# Patient Record
Sex: Female | Born: 1970 | Race: Black or African American | Hispanic: No | Marital: Married | Smoking: Current every day smoker
Health system: Southern US, Community
[De-identification: ages and names within clinical notes are randomized; demographics above are authoritative.]

## PROBLEM LIST (undated history)

## (undated) DIAGNOSIS — B019 Varicella without complication: Secondary | ICD-10-CM

## (undated) DIAGNOSIS — Z86718 Personal history of other venous thrombosis and embolism: Secondary | ICD-10-CM

## (undated) DIAGNOSIS — I10 Essential (primary) hypertension: Secondary | ICD-10-CM

## (undated) DIAGNOSIS — M75100 Unspecified rotator cuff tear or rupture of unspecified shoulder, not specified as traumatic: Secondary | ICD-10-CM

## (undated) HISTORY — DX: Varicella without complication: B01.9

## (undated) HISTORY — DX: Personal history of other venous thrombosis and embolism: Z86.718

## (undated) HISTORY — PX: TUBAL LIGATION: SHX77

## (undated) HISTORY — PX: BREAST REDUCTION SURGERY: SHX8

## (undated) HISTORY — PX: OTHER SURGICAL HISTORY: SHX169

## (undated) HISTORY — PX: BREAST SURGERY: SHX581

---

## 1997-12-29 ENCOUNTER — Ambulatory Visit (HOSPITAL_COMMUNITY): Admission: RE | Admit: 1997-12-29 | Discharge: 1997-12-29 | Payer: Self-pay | Admitting: Obstetrics

## 1997-12-29 ENCOUNTER — Other Ambulatory Visit: Admission: RE | Admit: 1997-12-29 | Discharge: 1997-12-29 | Payer: Self-pay | Admitting: Obstetrics

## 1998-02-24 ENCOUNTER — Inpatient Hospital Stay (HOSPITAL_COMMUNITY): Admission: AD | Admit: 1998-02-24 | Discharge: 1998-02-24 | Payer: Self-pay | Admitting: Obstetrics

## 1998-03-12 ENCOUNTER — Ambulatory Visit (HOSPITAL_COMMUNITY): Admission: RE | Admit: 1998-03-12 | Discharge: 1998-03-12 | Payer: Self-pay | Admitting: Obstetrics

## 1998-05-07 ENCOUNTER — Ambulatory Visit (HOSPITAL_COMMUNITY): Admission: RE | Admit: 1998-05-07 | Discharge: 1998-05-07 | Payer: Self-pay | Admitting: Obstetrics

## 1998-05-29 ENCOUNTER — Inpatient Hospital Stay (HOSPITAL_COMMUNITY): Admission: AD | Admit: 1998-05-29 | Discharge: 1998-05-31 | Payer: Self-pay | Admitting: Obstetrics

## 1999-03-23 ENCOUNTER — Other Ambulatory Visit: Admission: RE | Admit: 1999-03-23 | Discharge: 1999-03-23 | Payer: Self-pay | Admitting: Obstetrics

## 1999-04-07 ENCOUNTER — Encounter: Payer: Self-pay | Admitting: *Deleted

## 1999-04-07 ENCOUNTER — Inpatient Hospital Stay (HOSPITAL_COMMUNITY): Admission: AD | Admit: 1999-04-07 | Discharge: 1999-04-07 | Payer: Self-pay | Admitting: Obstetrics

## 1999-07-08 ENCOUNTER — Inpatient Hospital Stay (HOSPITAL_COMMUNITY): Admission: AD | Admit: 1999-07-08 | Discharge: 1999-07-08 | Payer: Self-pay | Admitting: Obstetrics

## 1999-09-09 ENCOUNTER — Inpatient Hospital Stay (HOSPITAL_COMMUNITY): Admission: AD | Admit: 1999-09-09 | Discharge: 1999-09-09 | Payer: Self-pay | Admitting: Obstetrics

## 1999-09-13 ENCOUNTER — Ambulatory Visit (HOSPITAL_COMMUNITY): Admission: RE | Admit: 1999-09-13 | Discharge: 1999-09-13 | Payer: Self-pay | Admitting: Obstetrics

## 1999-09-13 ENCOUNTER — Encounter: Payer: Self-pay | Admitting: Obstetrics

## 1999-09-15 ENCOUNTER — Encounter (HOSPITAL_COMMUNITY): Admission: RE | Admit: 1999-09-15 | Discharge: 1999-10-18 | Payer: Self-pay | Admitting: Obstetrics

## 1999-10-04 ENCOUNTER — Encounter: Payer: Self-pay | Admitting: Obstetrics

## 1999-10-12 ENCOUNTER — Observation Stay (HOSPITAL_COMMUNITY): Admission: AD | Admit: 1999-10-12 | Discharge: 1999-10-12 | Payer: Self-pay | Admitting: Obstetrics

## 1999-10-16 ENCOUNTER — Inpatient Hospital Stay (HOSPITAL_COMMUNITY): Admission: AD | Admit: 1999-10-16 | Discharge: 1999-10-16 | Payer: Self-pay | Admitting: Obstetrics

## 1999-10-17 ENCOUNTER — Encounter (INDEPENDENT_AMBULATORY_CARE_PROVIDER_SITE_OTHER): Payer: Self-pay

## 1999-10-17 ENCOUNTER — Inpatient Hospital Stay (HOSPITAL_COMMUNITY): Admission: AD | Admit: 1999-10-17 | Discharge: 1999-10-20 | Payer: Self-pay | Admitting: Obstetrics

## 2000-07-08 ENCOUNTER — Emergency Department (HOSPITAL_COMMUNITY): Admission: EM | Admit: 2000-07-08 | Discharge: 2000-07-09 | Payer: Self-pay | Admitting: Emergency Medicine

## 2000-07-09 ENCOUNTER — Encounter: Payer: Self-pay | Admitting: Emergency Medicine

## 2000-07-11 ENCOUNTER — Encounter: Admission: RE | Admit: 2000-07-11 | Discharge: 2000-07-11 | Payer: Self-pay | Admitting: Family Medicine

## 2000-10-18 ENCOUNTER — Encounter: Admission: RE | Admit: 2000-10-18 | Discharge: 2000-10-18 | Payer: Self-pay | Admitting: Family Medicine

## 2002-01-17 ENCOUNTER — Encounter: Admission: RE | Admit: 2002-01-17 | Discharge: 2002-01-17 | Payer: Self-pay | Admitting: Family Medicine

## 2002-02-18 ENCOUNTER — Encounter: Admission: RE | Admit: 2002-02-18 | Discharge: 2002-02-18 | Payer: Self-pay | Admitting: Family Medicine

## 2003-11-18 ENCOUNTER — Emergency Department (HOSPITAL_COMMUNITY): Admission: EM | Admit: 2003-11-18 | Discharge: 2003-11-18 | Payer: Self-pay | Admitting: Emergency Medicine

## 2005-01-20 ENCOUNTER — Emergency Department (HOSPITAL_COMMUNITY): Admission: EM | Admit: 2005-01-20 | Discharge: 2005-01-21 | Payer: Self-pay | Admitting: Emergency Medicine

## 2007-11-11 ENCOUNTER — Ambulatory Visit: Payer: Self-pay | Admitting: *Deleted

## 2007-11-11 ENCOUNTER — Ambulatory Visit: Payer: Self-pay | Admitting: Internal Medicine

## 2012-01-11 ENCOUNTER — Other Ambulatory Visit: Payer: Self-pay | Admitting: Surgical

## 2012-01-11 MED ORDER — BUPIVACAINE LIPOSOME 1.3 % IJ SUSP: 20.0000 mL | Freq: Once | INTRAMUSCULAR | Status: DC

## 2012-01-12 ENCOUNTER — Encounter (HOSPITAL_COMMUNITY): Payer: Self-pay | Admitting: Pharmacy Technician

## 2012-01-16 ENCOUNTER — Ambulatory Visit (HOSPITAL_COMMUNITY)
Admission: RE | Admit: 2012-01-16 | Discharge: 2012-01-16 | Disposition: A | Discharge status: Home / Self Care | Payer: Medicaid Other | Source: Ambulatory Visit | Attending: Orthopedic Surgery | Admitting: Orthopedic Surgery

## 2012-01-16 ENCOUNTER — Encounter (HOSPITAL_COMMUNITY)
Admission: RE | Admit: 2012-01-16 | Discharge: 2012-01-16 | Disposition: A | Discharge status: Home / Self Care | Payer: Medicaid Other | Source: Ambulatory Visit | Attending: Orthopedic Surgery | Admitting: Orthopedic Surgery

## 2012-01-16 ENCOUNTER — Encounter (HOSPITAL_COMMUNITY): Payer: Self-pay

## 2012-01-16 DIAGNOSIS — J984 Other disorders of lung: Secondary | ICD-10-CM | POA: Insufficient documentation

## 2012-01-16 DIAGNOSIS — Z01818 Encounter for other preprocedural examination: Secondary | ICD-10-CM | POA: Insufficient documentation

## 2012-01-16 DIAGNOSIS — Z01812 Encounter for preprocedural laboratory examination: Secondary | ICD-10-CM | POA: Insufficient documentation

## 2012-01-16 DIAGNOSIS — X58XXXA Exposure to other specified factors, initial encounter: Secondary | ICD-10-CM | POA: Insufficient documentation

## 2012-01-16 DIAGNOSIS — Z01810 Encounter for preprocedural cardiovascular examination: Secondary | ICD-10-CM | POA: Insufficient documentation

## 2012-01-16 DIAGNOSIS — I440 Atrioventricular block, first degree: Secondary | ICD-10-CM | POA: Insufficient documentation

## 2012-01-16 DIAGNOSIS — Z87891 Personal history of nicotine dependence: Secondary | ICD-10-CM | POA: Insufficient documentation

## 2012-01-16 DIAGNOSIS — S43429A Sprain of unspecified rotator cuff capsule, initial encounter: Secondary | ICD-10-CM | POA: Insufficient documentation

## 2012-01-16 HISTORY — DX: Essential (primary) hypertension: I10

## 2012-01-16 HISTORY — DX: Unspecified rotator cuff tear or rupture of unspecified shoulder, not specified as traumatic: M75.100

## 2012-01-16 LAB — BASIC METABOLIC PANEL
BUN: 14 mg/dL (ref 6–23)
Chloride: 103 mEq/L (ref 96–112)
GFR calc Af Amer: >90 mL/min (ref >90)
GFR calc non Af Amer: >90 mL/min (ref >90)
Potassium: 3.2 mEq/L — ABNORMAL LOW (ref 3.5–5.1)
Sodium: 140 mEq/L (ref 135–145)

## 2012-01-16 LAB — CBC
HCT: 35.1 % — ABNORMAL LOW (ref 36.0–46.0)
MCHC: 31.1 g/dL (ref 30.0–36.0)
Platelets: 314 10*3/uL (ref 150–400)
RDW: 18.1 % — ABNORMAL HIGH (ref 11.5–15.5)
WBC: 6.3 10*3/uL (ref 4.0–10.5)

## 2012-01-16 LAB — HCG, SERUM, QUALITATIVE: Preg, Serum: NEGATIVE

## 2012-01-16 LAB — SURGICAL PCR SCREEN: Staphylococcus aureus: NEGATIVE

## 2012-01-16 NOTE — Patient Instructions (Signed)
20 Cathy Brown  01/16/2012   Your procedure is scheduled on:  01/18/12  Report to SHORT STAY DEPT  at 10:00 AM.  Call this number if you have problems the morning of surgery: [phone]   Remember:   Do not eat food or drink liquids AFTER MIDNIGHT  May have clear liquids UNTIL 6 HOURS BEFORE SURGERY(6:30 AM)  Clear liquids include soda, tea, black coffee, apple or grape juice, broth.  Take these medicines the morning of surgery with A SIP OF WATER: NONE   Do not wear jewelry, make-up or nail polish.  Do not wear lotions, powders, or perfumes.   Do not shave legs or underarms 48 hrs. before surgery (men may shave face)  Do not bring valuables to the hospital.  Contacts, dentures or bridgework may not be worn into surgery.  Leave suitcase in the car. After surgery it may be brought to your room.  For patients admitted to the hospital, checkout time is 11:00 AM the day of discharge.   Patients discharged the day of surgery will not be allowed to drive home.    Special Instructions:   Please read over the following fact sheets that you were given: MRSA  Information / Incentive Spirometer               SHOWER WITH BETASEPT THE NIGHT BEFORE SURGERY AND THE MORNING OF SURGERY

## 2012-01-17 NOTE — H&P (Signed)
Cathy Brown DOB: 1970-10-07   Chief Complaint: Right shoulder pain  History of Present Illness Cathy Brown is a 41 year old female who presented with the chief complaint of right shoulder pain. She stated that over the past 2 years she has had right shoulder pain and limited motion. She also reported weakness in that shoulder. 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 She stated that she did have some pain in the lower arm and occasional numbness and tingling in the right hand. She reported that she does not have neck pain. She stated that she was given prednisone by her PCP a few weeks ago which did not help. She stated that the along thing that has provided her with relief is putting biofreeze followed by a heating pad at night. She stated that this only provides her enough relief in order for her to get some rest. She had a cortisone injection in the right shoulder without relief. MRI revealed a significant tear, 1.4 x 1.8 cm., involving more than 50% thickness of the rotator cuff on the right.  Past Medical History Hypertension   Allergies No Known Drug Allergies.   Family History Diabetes Mellitus. grandmother fathers side Hypertension. mother, sister and grandmother fathers side   Social History Alcohol use. current drinker; drinks wine; only occasionally per week Children. 2 Current work status. working full time Drug/Alcohol Rehab (Currently). no Drug/Alcohol Rehab (Previously). no Illicit drug use. no Living situation. live with spouse Marital status. married Pain Contract. no Tobacco / smoke exposure. yes outdoors only Tobacco use. current every day smoker   Medication History Hydrochlorothiazide (25MG  Tablet, Oral) Active.   Pregnancy / Birth History Pregnant. no   Past Surgical History Mammoplasty;  Reduction. bilateral Tubal Ligation     Review of Systems General:Not Present- Chills, Fever, Night Sweats, Appetite Loss, Fatigue, Feeling sick, Weight Gain and Weight Loss. Skin:Not Present- Itching, Rash, Skin Color Changes, Ulcer, Psoriasis and Change in Hair or Nails. HEENT:Not Present- Sensitivity to light, Hearing problems, Nose Bleed and Ringing in the Ears. Neck:Not Present- Swollen Glands and Neck Mass. Respiratory:Not Present- Snoring, Chronic Cough, Bloody sputum and Dyspnea. Cardiovascular:Not Present- Shortness of Breath, Chest Pain, Swelling of Extremities, Leg Cramps and Palpitations. Gastrointestinal:Not Present- Bloody Stool, Heartburn, Abdominal Pain, Vomiting, Nausea and Incontinence of Stool. Female Genitourinary:Not Present- Blood in Urine, Menstrual Irregularities, Frequency, Incontinence and Nocturia. Musculoskeletal:Present- Joint Stiffness and Joint Pain. Not Present- Muscle Weakness, Muscle Pain, Joint Swelling and Back Pain. Neurological:Not Present- Tingling, Numbness, Burning, Tremor, Headaches and Dizziness. Psychiatric:Not Present- Anxiety, Depression and Memory Loss. Endocrine:Not Present- Cold Intolerance, Heat Intolerance, Excessive hunger and Excessive Thirst. Hematology:Not Present- Abnormal Bleeding, Anemia, Blood Clots and Easy Bruising.   Vitals Weight: 238 lb Height: 68.25 in Body Surface Area: 2.28 m Body Mass Index: 35.92 kg/m Pulse: 78 (Regular) BP: 132/92 (Sitting, Left Arm, Standard)    Physical Exam Alert and oriented. No acute distress. Left shoulder, elbow, and wrist normal painless ROM. No tenderness to palpation. Right elbow and wrist normal ROM. No pain with motion. No tenderness. Grip strength 5/5. Sensation intact. Radial pulses 2+. Right shoulder has pain with abduction. Increase in pain with motion about 75 degrees. Tender to palpation over the anterior portion of the right shoulder. AC joint  intact. Strength 3/5 in right shoulder against resistance. 5/5 on the left. No pain with motion or with palpation  about the cervical spine. No masses or tumors. Heart sounds normal, RRR, no murmurs. Lungs clear to auscultation. Abdomen soft and nontender. Bowel sounds active. Neck supple, no carotid bruits. Oral cavity clear. EOM intact.    Assessment & Plan Rotator cuff tear, right shoulder Open rotator cuff repair, right shoulder, with possible graft and anchors     Dimitri Ped, PA-C

## 2012-01-18 ENCOUNTER — Encounter (HOSPITAL_COMMUNITY): Payer: Self-pay | Admitting: *Deleted

## 2012-01-18 ENCOUNTER — Encounter (HOSPITAL_COMMUNITY)
Admission: RE | Disposition: A | Discharge status: Home / Self Care | Payer: Self-pay | Source: Ambulatory Visit | Attending: Orthopedic Surgery

## 2012-01-18 ENCOUNTER — Encounter (HOSPITAL_COMMUNITY): Payer: Self-pay | Admitting: Anesthesiology

## 2012-01-18 ENCOUNTER — Ambulatory Visit (HOSPITAL_COMMUNITY)
Admission: RE | Admit: 2012-01-18 | Discharge: 2012-01-19 | Disposition: A | Discharge status: Home / Self Care | Payer: Medicaid Other | Source: Ambulatory Visit | Attending: Orthopedic Surgery | Admitting: Orthopedic Surgery

## 2012-01-18 ENCOUNTER — Ambulatory Visit (HOSPITAL_COMMUNITY): Payer: Medicaid Other | Admitting: Anesthesiology

## 2012-01-18 DIAGNOSIS — M25519 Pain in unspecified shoulder: Secondary | ICD-10-CM | POA: Insufficient documentation

## 2012-01-18 DIAGNOSIS — R209 Unspecified disturbances of skin sensation: Secondary | ICD-10-CM | POA: Insufficient documentation

## 2012-01-18 DIAGNOSIS — M25819 Other specified joint disorders, unspecified shoulder: Secondary | ICD-10-CM | POA: Insufficient documentation

## 2012-01-18 DIAGNOSIS — R29898 Other symptoms and signs involving the musculoskeletal system: Secondary | ICD-10-CM | POA: Insufficient documentation

## 2012-01-18 DIAGNOSIS — Z79899 Other long term (current) drug therapy: Secondary | ICD-10-CM | POA: Insufficient documentation

## 2012-01-18 DIAGNOSIS — M719 Bursopathy, unspecified: Secondary | ICD-10-CM | POA: Insufficient documentation

## 2012-01-18 DIAGNOSIS — M67919 Unspecified disorder of synovium and tendon, unspecified shoulder: Secondary | ICD-10-CM | POA: Insufficient documentation

## 2012-01-18 DIAGNOSIS — M7540 Impingement syndrome of unspecified shoulder: Secondary | ICD-10-CM

## 2012-01-18 DIAGNOSIS — I10 Essential (primary) hypertension: Secondary | ICD-10-CM | POA: Insufficient documentation

## 2012-01-18 HISTORY — PX: SHOULDER OPEN ROTATOR CUFF REPAIR: SHX2407

## 2012-01-18 SURGERY — REPAIR, ROTATOR CUFF, OPEN
Anesthesia: General | Site: Shoulder | Laterality: Right | Wound class: Clean

## 2012-01-18 MED ORDER — LACTATED RINGERS IV SOLN: INTRAVENOUS | Status: DC

## 2012-01-18 MED ORDER — LACTATED RINGERS IV SOLN
INTRAVENOUS | Status: DC
  Administered 2012-01-18: 1000 mL via INTRAVENOUS

## 2012-01-18 MED ORDER — THROMBIN 5000 UNITS EX SOLR
CUTANEOUS | Status: DC | PRN
  Administered 2012-01-18: 5000 [IU] via TOPICAL

## 2012-01-18 MED ORDER — BACITRACIN ZINC 500 UNIT/GM EX OINT
TOPICAL_OINTMENT | CUTANEOUS | Status: AC
  Filled 2012-01-18: qty 15

## 2012-01-18 MED ORDER — ROCURONIUM BROMIDE 100 MG/10ML IV SOLN
INTRAVENOUS | Status: DC | PRN
  Administered 2012-01-18: 40 mg via INTRAVENOUS

## 2012-01-18 MED ORDER — PHENOL 1.4 % MT LIQD: 1.0000 | OROMUCOSAL | Status: DC | PRN

## 2012-01-18 MED ORDER — ACETAMINOPHEN 10 MG/ML IV SOLN
INTRAVENOUS | Status: DC | PRN
  Administered 2012-01-18: 1000 mg via INTRAVENOUS

## 2012-01-18 MED ORDER — HYDROMORPHONE HCL PF 1 MG/ML IJ SOLN
0.5000 mg | INTRAMUSCULAR | Status: DC | PRN
  Administered 2012-01-18: 0.5 mg via INTRAVENOUS
  Filled 2012-01-18: qty 1

## 2012-01-18 MED ORDER — METHOCARBAMOL 500 MG PO TABS
500.0000 mg | ORAL_TABLET | Freq: Four times a day (QID) | ORAL | Status: DC | PRN
  Administered 2012-01-19 (×2): 500 mg via ORAL
  Filled 2012-01-18 (×2): qty 1

## 2012-01-18 MED ORDER — HYDROMORPHONE HCL PF 1 MG/ML IJ SOLN
INTRAMUSCULAR | Status: AC
  Administered 2012-01-18: 0.5 mg via INTRAVENOUS
  Filled 2012-01-18: qty 1

## 2012-01-18 MED ORDER — HYDROMORPHONE HCL 2 MG PO TABS
2.0000 mg | ORAL_TABLET | ORAL | Status: DC | PRN
  Administered 2012-01-18 – 2012-01-19 (×5): 2 mg via ORAL
  Filled 2012-01-18 (×5): qty 1

## 2012-01-18 MED ORDER — METHOCARBAMOL 100 MG/ML IJ SOLN
500.0000 mg | Freq: Four times a day (QID) | INTRAVENOUS | Status: DC | PRN
  Administered 2012-01-18: 500 mg via INTRAVENOUS
  Filled 2012-01-18 (×2): qty 5

## 2012-01-18 MED ORDER — ONDANSETRON HCL 4 MG PO TABS: 4.0000 mg | ORAL_TABLET | Freq: Four times a day (QID) | ORAL | Status: DC | PRN

## 2012-01-18 MED ORDER — GLYCOPYRROLATE 0.2 MG/ML IJ SOLN
INTRAMUSCULAR | Status: DC | PRN
  Administered 2012-01-18: .8 mg via INTRAVENOUS

## 2012-01-18 MED ORDER — BUPIVACAINE LIPOSOME 1.3 % IJ SUSP
20.0000 mL | INTRAMUSCULAR | Status: DC
  Filled 2012-01-18: qty 20

## 2012-01-18 MED ORDER — BISACODYL 10 MG RE SUPP: 10.0000 mg | Freq: Every day | RECTAL | Status: DC | PRN

## 2012-01-18 MED ORDER — MIDAZOLAM HCL 5 MG/5ML IJ SOLN
INTRAMUSCULAR | Status: DC | PRN
  Administered 2012-01-18: 2 mg via INTRAVENOUS

## 2012-01-18 MED ORDER — HYDROMORPHONE HCL PF 1 MG/ML IJ SOLN
0.2500 mg | INTRAMUSCULAR | Status: DC | PRN
  Administered 2012-01-18 (×2): 0.5 mg via INTRAVENOUS

## 2012-01-18 MED ORDER — PROPOFOL 10 MG/ML IV EMUL
INTRAVENOUS | Status: DC | PRN
  Administered 2012-01-18: 200 mg via INTRAVENOUS

## 2012-01-18 MED ORDER — NEOSTIGMINE METHYLSULFATE 1 MG/ML IJ SOLN
INTRAMUSCULAR | Status: DC | PRN
  Administered 2012-01-18: 5 mg via INTRAVENOUS

## 2012-01-18 MED ORDER — FLEET ENEMA 7-19 GM/118ML RE ENEM: 1.0000 | ENEMA | Freq: Once | RECTAL | Status: AC | PRN

## 2012-01-18 MED ORDER — ONDANSETRON HCL 4 MG/2ML IJ SOLN: 4.0000 mg | Freq: Four times a day (QID) | INTRAMUSCULAR | Status: DC | PRN

## 2012-01-18 MED ORDER — CEFAZOLIN SODIUM 1-5 GM-% IV SOLN
1.0000 g | Freq: Four times a day (QID) | INTRAVENOUS | Status: AC
  Administered 2012-01-18 – 2012-01-19 (×3): 1 g via INTRAVENOUS
  Filled 2012-01-18 (×3): qty 50

## 2012-01-18 MED ORDER — CEFAZOLIN SODIUM-DEXTROSE 2-3 GM-% IV SOLR
2.0000 g | Freq: Once | INTRAVENOUS | Status: AC
  Administered 2012-01-18: 2 g via INTRAVENOUS

## 2012-01-18 MED ORDER — ONDANSETRON HCL 4 MG/2ML IJ SOLN
INTRAMUSCULAR | Status: DC | PRN
  Administered 2012-01-18: 4 mg via INTRAVENOUS

## 2012-01-18 MED ORDER — SUCCINYLCHOLINE CHLORIDE 20 MG/ML IJ SOLN
INTRAMUSCULAR | Status: DC | PRN
  Administered 2012-01-18: 100 mg via INTRAVENOUS

## 2012-01-18 MED ORDER — EPHEDRINE SULFATE 50 MG/ML IJ SOLN
INTRAMUSCULAR | Status: DC | PRN
  Administered 2012-01-18: 5 mg via INTRAVENOUS

## 2012-01-18 MED ORDER — CEFAZOLIN SODIUM-DEXTROSE 2-3 GM-% IV SOLR
INTRAVENOUS | Status: AC
  Filled 2012-01-18: qty 50

## 2012-01-18 MED ORDER — ACETAMINOPHEN 325 MG PO TABS: 650.0000 mg | ORAL_TABLET | Freq: Four times a day (QID) | ORAL | Status: DC | PRN

## 2012-01-18 MED ORDER — HYDROCHLOROTHIAZIDE 25 MG PO TABS
25.0000 mg | ORAL_TABLET | Freq: Every day | ORAL | Status: DC
  Filled 2012-01-18 (×2): qty 1

## 2012-01-18 MED ORDER — MENTHOL 3 MG MT LOZG: 1.0000 | LOZENGE | OROMUCOSAL | Status: DC | PRN

## 2012-01-18 MED ORDER — LIDOCAINE HCL (CARDIAC) 20 MG/ML IV SOLN
INTRAVENOUS | Status: DC | PRN
  Administered 2012-01-18: 100 mg via INTRAVENOUS

## 2012-01-18 MED ORDER — BUPIVACAINE LIPOSOME 1.3 % IJ SUSP
INTRAMUSCULAR | Status: DC | PRN
  Administered 2012-01-18: 20 mL

## 2012-01-18 MED ORDER — THROMBIN 5000 UNITS EX SOLR
CUTANEOUS | Status: AC
  Filled 2012-01-18: qty 5000

## 2012-01-18 MED ORDER — POLYETHYLENE GLYCOL 3350 17 G PO PACK: 17.0000 g | PACK | Freq: Every day | ORAL | Status: DC | PRN

## 2012-01-18 MED ORDER — BACITRACIN ZINC 500 UNIT/GM EX OINT
TOPICAL_OINTMENT | CUTANEOUS | Status: DC | PRN
  Administered 2012-01-18: 1 via TOPICAL

## 2012-01-18 MED ORDER — SODIUM CHLORIDE 0.9 % IR SOLN
Status: DC | PRN
  Administered 2012-01-18: 12:00:00

## 2012-01-18 MED ORDER — ACETAMINOPHEN 10 MG/ML IV SOLN
INTRAVENOUS | Status: AC
  Filled 2012-01-18: qty 100

## 2012-01-18 MED ORDER — ACETAMINOPHEN 650 MG RE SUPP: 650.0000 mg | Freq: Four times a day (QID) | RECTAL | Status: DC | PRN

## 2012-01-18 MED ORDER — FENTANYL CITRATE 0.05 MG/ML IJ SOLN
INTRAMUSCULAR | Status: DC | PRN
  Administered 2012-01-18 (×3): 50 ug via INTRAVENOUS

## 2012-01-18 SURGICAL SUPPLY — 51 items
BAG SPEC THK2 15X12 ZIP CLS (MISCELLANEOUS) ×1
BAG ZIPLOCK 12X15 (MISCELLANEOUS) ×2 IMPLANT
BLADE OSCILLATING/SAGITTAL (BLADE) ×2
BLADE SW THK.38XMED LNG THN (BLADE) ×1 IMPLANT
BNDG COHESIVE 6X5 TAN NS LF (GAUZE/BANDAGES/DRESSINGS) IMPLANT
BUR OVAL CARBIDE 4.0 (BURR) ×2 IMPLANT
CLEANER TIP ELECTROSURG 2X2 (MISCELLANEOUS) ×2 IMPLANT
CLOTH BEACON ORANGE TIMEOUT ST (SAFETY) ×2 IMPLANT
CLSR STERI-STRIP ANTIMIC 1/2X4 (GAUZE/BANDAGES/DRESSINGS) ×2 IMPLANT
DRAPE POUCH INSTRU U-SHP 10X18 (DRAPES) ×2 IMPLANT
DRSG ADAPTIC 3X8 NADH LF (GAUZE/BANDAGES/DRESSINGS) ×1 IMPLANT
DRSG EMULSION OIL 3X3 NADH (GAUZE/BANDAGES/DRESSINGS) ×2 IMPLANT
DRSG PAD ABDOMINAL 8X10 ST (GAUZE/BANDAGES/DRESSINGS) ×4 IMPLANT
DURAPREP 26ML APPLICATOR (WOUND CARE) ×2 IMPLANT
ELECT REM PT RETURN 9FT ADLT (ELECTROSURGICAL) ×2
ELECTRODE REM PT RTRN 9FT ADLT (ELECTROSURGICAL) ×1 IMPLANT
FLOSEAL 10ML (HEMOSTASIS) IMPLANT
GLOVE BIO SURGEON STRL SZ 6.5 (GLOVE) ×2 IMPLANT
GLOVE BIOGEL PI IND STRL 8 (GLOVE) ×1 IMPLANT
GLOVE BIOGEL PI IND STRL 8.5 (GLOVE) ×1 IMPLANT
GLOVE BIOGEL PI INDICATOR 8 (GLOVE) ×1
GLOVE BIOGEL PI INDICATOR 8.5 (GLOVE) ×1
GLOVE ECLIPSE 8.0 STRL XLNG CF (GLOVE) ×4 IMPLANT
GOWN PREVENTION PLUS LG XLONG (DISPOSABLE) ×4 IMPLANT
GOWN STRL REIN XL XLG (GOWN DISPOSABLE) ×4 IMPLANT
KIT BASIN OR (CUSTOM PROCEDURE TRAY) ×2 IMPLANT
MANIFOLD NEPTUNE II (INSTRUMENTS) ×2 IMPLANT
NDL MA TROC 1/2 (NEEDLE) IMPLANT
NEEDLE MA TROC 1/2 (NEEDLE) IMPLANT
NS IRRIG 1000ML POUR BTL (IV SOLUTION) IMPLANT
PACK SHOULDER CUSTOM OPM052 (CUSTOM PROCEDURE TRAY) ×2 IMPLANT
PAD ABD 7.5X8 STRL (GAUZE/BANDAGES/DRESSINGS) ×1 IMPLANT
PASSER SUT SWANSON 36MM LOOP (INSTRUMENTS) IMPLANT
POSITIONER SURGICAL ARM (MISCELLANEOUS) ×2 IMPLANT
SLING ARM IMMOBILIZER LRG (SOFTGOODS) ×2 IMPLANT
SPONGE GAUZE 4X4 12PLY (GAUZE/BANDAGES/DRESSINGS) ×1 IMPLANT
SPONGE SURGIFOAM ABS GEL 100 (HEMOSTASIS) IMPLANT
STAPLER VISISTAT 35W (STAPLE) ×2 IMPLANT
STRIP CLOSURE SKIN 1/2X4 (GAUZE/BANDAGES/DRESSINGS) ×2 IMPLANT
SUCTION FRAZIER 12FR DISP (SUCTIONS) ×2 IMPLANT
SUT BONE WAX W31G (SUTURE) ×2 IMPLANT
SUT ETHIBOND NAB CT1 #1 30IN (SUTURE) IMPLANT
SUT MNCRL AB 4-0 PS2 18 (SUTURE) ×2 IMPLANT
SUT VIC AB 0 CT1 27 (SUTURE) ×2
SUT VIC AB 0 CT1 27XBRD ANTBC (SUTURE) ×1 IMPLANT
SUT VIC AB 1 CT1 27 (SUTURE) ×4
SUT VIC AB 1 CT1 27XBRD ANTBC (SUTURE) ×2 IMPLANT
SUT VIC AB 2-0 CT1 27 (SUTURE)
SUT VIC AB 2-0 CT1 27XBRD (SUTURE) IMPLANT
TAPE CLOTH SURG 4X10 WHT LF (GAUZE/BANDAGES/DRESSINGS) ×2 IMPLANT
TOWEL OR 17X26 10 PK STRL BLUE (TOWEL DISPOSABLE) ×4 IMPLANT

## 2012-01-18 NOTE — Brief Op Note (Signed)
01/18/2012  1:48 PM  PATIENT:  Cathy Brown  41 y.o. female  PRE-OPERATIVE DIAGNOSIS:  right shoulder rotator cuff tear  POST-OPERATIVE DIAGNOSIS:  right shoulder Impingement Syndrome with Abraison of Rotator Cuff on the right.  PROCEDURE:  Procedure(s) (LRB): Open Acromionectomy and exploration of the Rotator Cuff.  SURGEON:  Surgeon(s) and Role:    * Jacki Cones, MD - Primary  PHYSICIAN ASSISTANT:Amber Celedonio Savage PA     ANESTHESIA:   general  EBL:     BLOOD ADMINISTERED:none  DRAINS: none   LOCAL MEDICATIONS USED:  BUPIVICAINE 20cc  SPECIMEN:  No Specimen  DISPOSITION OF SPECIMEN:  N/A  COUNTS:  YES  TOURNIQUET:  * No tourniquets in log *  DICTATION: .Other Dictation: Dictation Number [id number]  PLAN OF CARE: Admit for overnight observation  PATIENT DISPOSITION:  PACU - hemodynamically stable.   Delay start of Pharmacological VTE agent (>24hrs) due to surgical blood loss or risk of bleeding: yes

## 2012-01-18 NOTE — Anesthesia Preprocedure Evaluation (Addendum)
Anesthesia Evaluation  Patient identified by MRN, date of birth, ID band Patient awake    Reviewed: Allergy & Precautions, H&P , NPO status , Patient's Chart, lab work & pertinent test results  Airway Mallampati: II TM Distance: >3 FB Neck ROM: full    Dental No notable dental hx. (+) Teeth Intact and Dental Advisory Given   Pulmonary neg pulmonary ROS,  breath sounds clear to auscultation  Pulmonary exam normal       Cardiovascular Exercise Tolerance: Good hypertension, On Medications Rhythm:regular Rate:Normal  T wave changes on ECG   Neuro/Psych negative neurological ROS  negative psych ROS   GI/Hepatic negative GI ROS, Neg liver ROS,   Endo/Other  negative endocrine ROS  Renal/GU negative Renal ROS  negative genitourinary   Musculoskeletal   Abdominal   Peds  Hematology  (+) Blood dyscrasia, anemia , Hgb 10.9   Anesthesia Other Findings   Reproductive/Obstetrics negative OB ROS                           Anesthesia Physical Anesthesia Plan  ASA: II  Anesthesia Plan: General   Post-op Pain Management:    Induction: Intravenous  Airway Management Planned: Oral ETT  Additional Equipment:   Intra-op Plan:   Post-operative Plan: Extubation in OR  Informed Consent: I have reviewed the patients History and Physical, chart, labs and discussed the procedure including the risks, benefits and alternatives for the proposed anesthesia with the patient or authorized representative who has indicated his/her understanding and acceptance.   Dental Advisory Given  Plan Discussed with: CRNA and Surgeon  Anesthesia Plan Comments:         Anesthesia Quick Evaluation

## 2012-01-18 NOTE — Preoperative (Signed)
Beta Blockers   Reason not to administer Beta Blockers:Not Applicable 

## 2012-01-18 NOTE — Anesthesia Postprocedure Evaluation (Signed)
  Anesthesia Post-op Note  Patient: Cathy Brown  Procedure(s) Performed: Procedure(s) (LRB): ROTATOR CUFF REPAIR SHOULDER OPEN (Right)  Patient Location: PACU  Anesthesia Type: General  Level of Consciousness: awake and alert   Airway and Oxygen Therapy: Patient Spontanous Breathing  Post-op Pain: mild  Post-op Assessment: Post-op Vital signs reviewed, Patient's Cardiovascular Status Stable, Respiratory Function Stable, Patent Airway and No signs of Nausea or vomiting  Post-op Vital Signs: stable  Complications: No apparent anesthesia complications

## 2012-01-18 NOTE — Interval H&P Note (Signed)
History and Physical Interval Note:  01/18/2012 12:35 PM  Cathy Brown  has presented today for surgery, with the diagnosis of right shoulder rotator cuff tear  The various methods of treatment have been discussed with the patient and family. After consideration of risks, benefits and other options for treatment, the patient has consented to  Procedure(s) (LRB): ROTATOR CUFF REPAIR SHOULDER OPEN (Right) as a surgical intervention .  The patients' history has been reviewed, patient examined, no change in status, stable for surgery.  I have reviewed the patients' chart and labs.  Questions were answered to the patient's satisfaction.     Kendalynn Wideman A

## 2012-01-18 NOTE — Transfer of Care (Signed)
Immediate Anesthesia Transfer of Care Note  Patient: Cathy Brown  Procedure(s) Performed: Procedure(s) (LRB): ROTATOR CUFF REPAIR SHOULDER OPEN (Right)  Patient Location: PACU  Anesthesia Type: General  Level of Consciousness: awake, alert , oriented and patient cooperative  Airway & Oxygen Therapy: Patient Spontanous Breathing and Patient connected to face mask oxygen  Post-op Assessment: Report given to PACU RN and Post -op Vital signs reviewed and stable  Post vital signs: Reviewed and stable  Complications: No apparent anesthesia complications

## 2012-01-19 MED ORDER — METHOCARBAMOL 500 MG PO TABS: 500.0000 mg | ORAL_TABLET | Freq: Four times a day (QID) | ORAL | Status: AC | PRN

## 2012-01-19 MED ORDER — HYDROMORPHONE HCL 2 MG PO TABS: 2.0000 mg | ORAL_TABLET | ORAL | Status: AC | PRN

## 2012-01-19 NOTE — Op Note (Signed)
NAMEJAMIN, HUMPHRIES             ACCOUNT NO.:  [id number]  MEDICAL RECORD NO.:  [id number]  LOCATION:  1616                         FACILITY:  [facility]  PHYSICIAN:  Georges Lynch. Vianny Schraeder, M.D.DATE OF BIRTH:  05/30/1971  DATE OF PROCEDURE:  01/18/2012 DATE OF DISCHARGE:  01/19/2012                              OPERATIVE REPORT   SURGEON:  Windy Fast A. Darrelyn Hillock, M.D.  ASSISTANT:  Dimitri Ped, [place].  PREOPERATIVE DIAGNOSES: 1. Severe impingement syndrome, right shoulder. 2. Partial tear rotator cuff tendon, right shoulder.  POSTOPERATIVE DIAGNOSES: 1. Severe impingement, right shoulder. 2. Rotator cuff was abraded, but not torn.  DESCRIPTION OF PROCEDURE:  Under general anesthesia, the patient in the beach chair position, routine orthopedic prep and draping of the right upper extremity was carried out.  She had 2 g of IV Ancef.  The appropriate time-out was first carried out before any surgery was done. I also marked the appropriate right arm in the holding area.  Following that, after sterile prep and draping, I made an incision over the anterior aspect of the right shoulder.  An incision was made proximally, the bleeders identified and cauterized.  Great care was taken not to injure any of the underlying neurovascular structures.  I split a small proximal part of the deltoid muscle.  At this time, she had marked thickening and overgrowth and downsloping of the acromion.  I protected the underlying rotator cuff, and did a partial acromionectomy and acromioplasty utilizing oscillating saw and the bur.  After that, I thoroughly irrigated out the area and I bone waxed the undersurface of the acromion.  At this time, I did a bursectomy.  I inspected the tendon, I looked, internally and externally rotated  the shoulder.  She had some abrasions of the tendon, where there was really no major tears to repair.  She did not require graft or anchors.  I thoroughly debrided the area and then  reapproximated deltoid tendon muscle in usual fashion. Subcu was closed with 0 Vicryl, and we also utilized the subcuticular skin closure.  We injected prior to closing the wound 20 cc of Exparel into the wound site.  Sterile dressing was applied.  She will be placed in a sling.          ______________________________ Georges Lynch. Darrelyn Hillock, M.D.     RAG/MEDQ  D:  01/18/2012  T:  01/19/2012  Job:  [id number]

## 2012-01-19 NOTE — Progress Notes (Signed)
Subjective: Doing Well today. No problems with her post op course.   Objective: Vital signs in last 24 hours: Temp:  [97.5 F (36.4 C)-98.8 F (37.1 C)] 98.8 F (37.1 C) (05/17 0525) Pulse Rate:  [53-92] 70  (05/17 0525) Resp:  [13-24] 16  (05/17 0525) BP: (90-116)/(63-84) 95/68 mmHg (05/17 0525) SpO2:  [94 %-100 %] 99 % (05/17 0525) Weight:  [105.433 kg (232 lb 7 oz)] 105.433 kg (232 lb 7 oz) (05/16 1845)  Intake/Output from previous day: 05/16 0701 - 05/17 0700 In: 1670 [I.V.:1520; IV Piggyback:150] Out: 570 [Urine:550; Blood:20] Intake/Output this shift:     Basename 01/16/12 1055  HGB 10.9*    Basename 01/16/12 1055  WBC 6.3  RBC 5.20*  HCT 35.1*  PLT 314    Basename 01/16/12 1055  NA 140  K 3.2*  CL 103  CO2 27  BUN 14  CREATININE 0.65  GLUCOSE 84  CALCIUM 9.5   No results found for this basename: LABPT:2,INR:2 in the last 72 hours  Neurologically intact  Assessment/Plan: DC today.Will see in office in two weeks.   Cyndel Griffey A 01/19/2012, 7:28 AM

## 2012-01-19 NOTE — Progress Notes (Signed)
D/c instructions given and explained to pt. Prescriptions for dilaudid and robaxin given to pt, placed into pt's hands.  Iv site d/c, pressure dressing applied to site.  Dressing changed by occupational therapist, she instructed pt on proper dressing changes also.  Pt alert and stable and ready for d/c.  Pt d/c at this time via car to home.

## 2012-01-19 NOTE — Progress Notes (Signed)
OT Note:  Shoulder eval completed and filed in shadow chart.  Pt will follow up with Dr. Darrelyn Hillock for follow up therapy.  Kings Grant, OTR/L [phone] 01/19/2012

## 2012-01-22 NOTE — Discharge Summary (Signed)
Physician Discharge Summary   Patient ID: Cathy Brown MRN: [id number] DOB/AGE: 1971/02/12 41 y.o.  Admit date: 01/18/2012 Discharge date: 01/19/2012  Primary Diagnosis:  Rotator cuff impingement, right shoulder  Admission Diagnoses:  Past Medical History  Diagnosis Date  . Hypertension   . Rotator cuff tear     RIGHT   Discharge Diagnoses:   Rotator cuff impingement, right shoulder S/P open right shoulder acromionectomy   Procedure:  Procedure(s) (LRB): ROTATOR CUFF REPAIR SHOULDER OPEN (Right)   Consults: None  HPI: Cathy Brown is a 41 year old female who presented with the chief complaint of right shoulder pain. She stated that over the past 2 years she has had right shoulder pain and limited motion. She also reported weakness in that shoulder. 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 She stated that she did have some pain in the lower arm and occasional numbness and tingling in the right hand. She reported that she does not have neck pain. She stated that she was given prednisone by her PCP a few weeks ago which did not help. She stated that the along thing that has provided her with relief is putting biofreeze followed by a heating pad at night. She stated that this only provides her enough relief in order for her to get some rest. She had a cortisone injection in the right shoulder without relief. MRI revealed a significant tear, 1.4 x 1.8 cm., involving more than 50% thickness of the rotator cuff on the right.       Laboratory Data: Hospital Outpatient Visit on 01/16/2012  Component Date Value Range Status  . MRSA, PCR  01/16/2012 NEGATIVE  NEGATIVE Final  . Staphylococcus aureus  01/16/2012 NEGATIVE  NEGATIVE Final   Comment:                                 The Xpert SA Assay (FDA                          approved for NASAL specimens                 only), is one component of                          a comprehensive surveillance                          program.  It is not intended                          to diagnose infection nor to                          guide or monitor treatment.  . Preg, Serum  01/16/2012 NEGATIVE  NEGATIVE Final  . WBC (K/uL) 01/16/2012 6.3  4.0-10.5 Final  . RBC (MIL/uL) 01/16/2012 5.20* 3.87-5.11 Final  . Hemoglobin (g/dL) 62/13/0865 78.4* 69.6-29.5 Final  . HCT (%) 01/16/2012 35.1* 36.0-46.0 Final  . MCV (fL) 01/16/2012 67.5* 78.0-100.0 Final  . MCH (pg) 01/16/2012 21.0* 26.0-34.0 Final  . MCHC (g/dL) 28/41/3244 01.0  27.2-53.6 Final  . RDW (%) 01/16/2012  18.1* 11.5-15.5 Final  . Platelets (K/uL) 01/16/2012 314  150-400 Final  . Sodium (mEq/L) 01/16/2012 140  135-145 Final  . Potassium (mEq/L) 01/16/2012 3.2* 3.5-5.1 Final  . Chloride (mEq/L) 01/16/2012 103  96-112 Final  . CO2 (mEq/L) 01/16/2012 27  19-32 Final  . Glucose, Bld (mg/dL) 33/29/5188 84  41-66 Final  . BUN (mg/dL) 03/03/1600 14  0-93 Final  . Creatinine, Ser (mg/dL) 23/55/7322 0.25  4.27-0.62 Final  . Calcium (mg/dL) 37/62/8315 9.5  1.7-61.6 Final  . GFR calc non Af Amer (mL/min) 01/16/2012 >90  >90 Final  . GFR calc Af Amer (mL/min) 01/16/2012 >90  >90 Final   Comment:                                 The eGFR has been calculated                          using the CKD EPI equation.                          This calculation has not been                          validated in all clinical                          situations.                          eGFR's persistently                          <90 mL/min signify                          possible Chronic Kidney Disease.    X-Rays:Dg Chest 2 View  01/16/2012  *RADIOLOGY REPORT*  Clinical Data: Preop for right rotator cuff surgery, smoking history  CHEST - 2 VIEW  Comparison: None.  Findings: No active infiltrate or effusion is seen.  Probable linear scarring is noted in the  left upper lung field , but comparison with prior or follow-up chest x-ray is recommended. Mediastinal contours appear normal.  The heart is within normal limits in size.  No bony abnormality is seen.  IMPRESSION: Probable linear scarring in the left upper lung field.  Compare with prior or follow-up chest x-ray.  No active lung disease  Original Report Authenticated By: Juline Patch, M.D.    EKG: Orders placed during the hospital encounter of 01/18/12  . EKG     Hospital Course: Patient was admitted to [facility] and taken to the OR and underwent the above state procedure without complications.  Patient tolerated the procedure well and was later transferred to the recovery room and then to the orthopaedic floor for postoperative care.  They were given PO and IV analgesics for pain control following their surgery. OT was ordered for sling management instruction.  Discharge planning consulted to help with postop disposition and equipment needs.  Patient had a good night on the evening of surgery.  Patient was seen in rounds and was ready to go home after meeting with OT regarding sling.   Discharge Medications: Prior to Admission medications  Medication Sig Start Date End Date Taking? Authorizing Provider  acetaminophen (TYLENOL) 500 MG tablet Take 500 mg by mouth every 6 (six) hours as needed.   Yes Historical Provider, MD  hydrochlorothiazide (HYDRODIURIL) 25 MG tablet Take 25 mg by mouth daily.   Yes Historical Provider, MD  naproxen (NAPROSYN) 250 MG tablet Take 250 mg by mouth 2 (two) times daily with a meal.   Yes Historical Provider, MD  HYDROmorphone (DILAUDID) 2 MG tablet Take 1 tablet (2 mg total) by mouth every 3 (three) hours as needed (Q4-6 hours PRN). 01/19/12 01/29/12  Coy Rochford Tamala Ser, PA  methocarbamol (ROBAXIN) 500 MG tablet Take 1 tablet (500 mg total) by mouth every 6 (six) hours as needed. 01/19/12 01/29/12  Opaline Reyburn Tamala Ser, PA    Diet: Heart healthy  diet Activity: Wear sling on right arm at all times, except showering Follow-up:in 2 weeks Disposition - Home Discharged Condition: good   Discharge Orders    Future Orders Please Complete By Expires   Diet - low sodium heart healthy      Call MD / Call 911      Comments:   If you experience chest pain or shortness of breath, CALL 911 and be transported to the hospital emergency room.  If you develope a fever above 101 F, pus (white drainage) or increased drainage or redness at the wound, or calf pain, call your surgeon's office.   Constipation Prevention      Comments:   Drink plenty of fluids.  Prune juice may be helpful.  You may use a stool softener, such as Colace (over the counter) 100 mg twice a day.  Use MiraLax (over the counter) for constipation as needed.   Discharge instructions      Comments:   Keep your sling on at all times, including sleeping in your sling.  The only time you should remove your sling is to shower only but you need to keep your hand against your chest while you shower.   For the first few days, remove your dressing, tape a piece of saran wrap over your incision, take your shower, then remove the saran wrap and put a clean dressing on, then reapply your sling.  After two days you can shower without the saran wrap.   Call Dr. Darrelyn Hillock if any wound complications or temperature of 101 degrees F or over.   Call the office for an appointment to see Dr. Darrelyn Hillock in two weeks: [phone] and ask for Dr. Jeannetta Ellis nurse, Mackey Birchwood.   Increase activity slowly as tolerated      Comments:   Wear sling on operative arm at all times, except showering   Driving restrictions      Comments:   No driving   Lifting restrictions      Comments:   No lifting     Medication List  As of 01/22/2012  6:56 AM   TAKE these medications         acetaminophen 500 MG tablet   Commonly known as: TYLENOL   Take 500 mg by mouth every 6 (six) hours as needed.       hydrochlorothiazide 25 MG tablet   Commonly known as: HYDRODIURIL   Take 25 mg by mouth daily.      HYDROmorphone 2 MG tablet   Commonly known as: DILAUDID   Take 1 tablet (2 mg total) by mouth every 3 (three) hours as needed (Q4-6 hours PRN).      methocarbamol 500  MG tablet   Commonly known as: ROBAXIN   Take 1 tablet (500 mg total) by mouth every 6 (six) hours as needed.      NAPROSYN 250 MG tablet   Generic drug: naproxen   Take 250 mg by mouth 2 (two) times daily with a meal.             Signed: Merilee Wible LAUREN 01/22/2012, 6:56 AM

## 2012-01-24 ENCOUNTER — Encounter (HOSPITAL_COMMUNITY): Payer: Self-pay | Admitting: Orthopedic Surgery

## 2012-11-15 ENCOUNTER — Emergency Department (INDEPENDENT_AMBULATORY_CARE_PROVIDER_SITE_OTHER): Payer: Medicaid Other

## 2012-11-15 ENCOUNTER — Emergency Department (INDEPENDENT_AMBULATORY_CARE_PROVIDER_SITE_OTHER)
Admission: EM | Admit: 2012-11-15 | Discharge: 2012-11-15 | Disposition: A | Discharge status: Home / Self Care | Payer: Medicaid Other | Source: Home / Self Care | Attending: Emergency Medicine | Admitting: Emergency Medicine

## 2012-11-15 ENCOUNTER — Encounter (HOSPITAL_COMMUNITY): Payer: Self-pay | Admitting: *Deleted

## 2012-11-15 DIAGNOSIS — S139XXA Sprain of joints and ligaments of unspecified parts of neck, initial encounter: Secondary | ICD-10-CM

## 2012-11-15 DIAGNOSIS — S46011A Strain of muscle(s) and tendon(s) of the rotator cuff of right shoulder, initial encounter: Secondary | ICD-10-CM

## 2012-11-15 DIAGNOSIS — S39012A Strain of muscle, fascia and tendon of lower back, initial encounter: Secondary | ICD-10-CM

## 2012-11-15 MED ORDER — METHOCARBAMOL 500 MG PO TABS: 500.0000 mg | ORAL_TABLET | Freq: Three times a day (TID) | ORAL | Status: DC

## 2012-11-15 MED ORDER — MELOXICAM 15 MG PO TABS: 15.0000 mg | ORAL_TABLET | Freq: Every day | ORAL | Status: DC

## 2012-11-15 MED ORDER — IBUPROFEN 800 MG PO TABS
ORAL_TABLET | ORAL | Status: AC
  Filled 2012-11-15: qty 1

## 2012-11-15 MED ORDER — HYDROCODONE-ACETAMINOPHEN 5-325 MG PO TABS
1.0000 | ORAL_TABLET | Freq: Once | ORAL | Status: AC
  Administered 2012-11-15: 1 via ORAL

## 2012-11-15 MED ORDER — IBUPROFEN 800 MG PO TABS
800.0000 mg | ORAL_TABLET | Freq: Once | ORAL | Status: AC
  Administered 2012-11-15: 800 mg via ORAL

## 2012-11-15 MED ORDER — TRAMADOL HCL 50 MG PO TABS: 100.0000 mg | ORAL_TABLET | Freq: Three times a day (TID) | ORAL | Status: DC | PRN

## 2012-11-15 MED ORDER — HYDROCODONE-ACETAMINOPHEN 5-325 MG PO TABS
ORAL_TABLET | ORAL | Status: AC
  Filled 2012-11-15: qty 1
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
## 2013-01-16 ENCOUNTER — Emergency Department (HOSPITAL_COMMUNITY)
Admission: EM | Admit: 2013-01-16 | Discharge: 2013-01-16 | Disposition: A | Discharge status: Home / Self Care | Payer: Medicaid Other | Attending: Emergency Medicine | Admitting: Emergency Medicine

## 2013-01-16 ENCOUNTER — Encounter (HOSPITAL_COMMUNITY): Payer: Self-pay | Admitting: Cardiology

## 2013-01-16 ENCOUNTER — Ambulatory Visit (HOSPITAL_COMMUNITY)
Admission: RE | Admit: 2013-01-16 | Discharge: 2013-01-16 | Disposition: A | Discharge status: Home / Self Care | Payer: No Typology Code available for payment source | Source: Ambulatory Visit | Attending: Family Medicine | Admitting: Family Medicine

## 2013-01-16 ENCOUNTER — Other Ambulatory Visit (HOSPITAL_COMMUNITY): Payer: Self-pay | Admitting: Family Medicine

## 2013-01-16 DIAGNOSIS — F17200 Nicotine dependence, unspecified, uncomplicated: Secondary | ICD-10-CM | POA: Insufficient documentation

## 2013-01-16 DIAGNOSIS — M79609 Pain in unspecified limb: Secondary | ICD-10-CM | POA: Insufficient documentation

## 2013-01-16 DIAGNOSIS — M79661 Pain in right lower leg: Secondary | ICD-10-CM

## 2013-01-16 DIAGNOSIS — Z7901 Long term (current) use of anticoagulants: Secondary | ICD-10-CM | POA: Insufficient documentation

## 2013-01-16 DIAGNOSIS — M7989 Other specified soft tissue disorders: Secondary | ICD-10-CM

## 2013-01-16 DIAGNOSIS — I82409 Acute embolism and thrombosis of unspecified deep veins of unspecified lower extremity: Secondary | ICD-10-CM | POA: Insufficient documentation

## 2013-01-16 DIAGNOSIS — I10 Essential (primary) hypertension: Secondary | ICD-10-CM | POA: Insufficient documentation

## 2013-01-16 DIAGNOSIS — Z87828 Personal history of other (healed) physical injury and trauma: Secondary | ICD-10-CM | POA: Insufficient documentation

## 2013-01-16 DIAGNOSIS — I82402 Acute embolism and thrombosis of unspecified deep veins of left lower extremity: Secondary | ICD-10-CM

## 2013-01-16 LAB — BASIC METABOLIC PANEL
Calcium: 9.4 mg/dL (ref 8.4–10.5)
GFR calc Af Amer: >90 mL/min (ref >90)
GFR calc non Af Amer: >90 mL/min (ref >90)
Glucose, Bld: 89 mg/dL (ref 70–99)
Potassium: 3.8 mEq/L (ref 3.5–5.1)
Sodium: 137 mEq/L (ref 135–145)

## 2013-01-16 LAB — CBC WITH DIFFERENTIAL/PLATELET
Basophils Absolute: 0.1 10*3/uL (ref 0.0–0.1)
Eosinophils Absolute: 0.1 10*3/uL (ref 0.0–0.7)
HCT: 33.4 % — ABNORMAL LOW (ref 36.0–46.0)
Hemoglobin: 10.6 g/dL — ABNORMAL LOW (ref 12.0–15.0)
Lymphocytes Relative: 35 % (ref 12–46)
Monocytes Relative: 8 % (ref 3–12)
Neutro Abs: 3.7 10*3/uL (ref 1.7–7.7)
Neutrophils Relative %: 54 % (ref 43–77)
RDW: 19.5 % — ABNORMAL HIGH (ref 11.5–15.5)
WBC: 6.8 10*3/uL (ref 4.0–10.5)

## 2013-01-16 MED ORDER — RIVAROXABAN 15 MG PO TABS
15.0000 mg | ORAL_TABLET | Freq: Two times a day (BID) | ORAL | Status: DC
  Administered 2013-01-16: 15 mg via ORAL
  Filled 2013-01-16: qty 1

## 2013-01-16 MED ORDER — RIVAROXABAN 15 MG PO TABS: 15.0000 mg | ORAL_TABLET | Freq: Every day | ORAL | Status: DC

## 2013-01-16 MED ORDER — HYDROCODONE-ACETAMINOPHEN 5-325 MG PO TABS: 1.0000 | ORAL_TABLET | Freq: Four times a day (QID) | ORAL | Status: DC | PRN

## 2013-01-16 MED ORDER — ACETAMINOPHEN 325 MG PO TABS
650.0000 mg | ORAL_TABLET | Freq: Once | ORAL | Status: AC
  Administered 2013-01-16: 650 mg via ORAL
  Filled 2013-01-16: qty 2

## 2013-01-16 NOTE — ED Notes (Signed)
Pt here with positive DVT- pt reports that she has been having pain in her right lower leg for 3 weeks. States her PCP sent her for Doppler which was positive. Denies any SOB or chest pain.

## 2013-01-16 NOTE — Progress Notes (Addendum)
ANTICOAGULATION CONSULT NOTE - Initial Consult  Pharmacy Consult for xarelto Indication: new DVT  Allergies  Allergen Reactions  . Percocet (Oxycodone-Acetaminophen) Nausea And Vomiting    Patient Measurements: Height: 5\' 8"  (172.7 cm) Weight: 257 lb (116.574 kg) IBW/kg (Calculated) : 63.9  Vital Signs: Temp: 98.4 F (36.9 C) (05/15 1520) Temp src: Oral (05/15 1520) BP: 113/91 mmHg (05/15 1621) Pulse Rate: 74 (05/15 1621)  Labs: No results found for this basename: HGB, HCT, PLT, APTT, LABPROT, INR, HEPARINUNFRC, CREATININE, CKTOTAL, CKMB, TROPONINI,  in the last 72 hours  Estimated Creatinine Clearance: 122.9 ml/min (by C-G formula based on Cr of 0.65).   Medical History: Past Medical History  Diagnosis Date  . Hypertension   . Rotator cuff tear     RIGHT    Medications:  Acetaminophen, lisinopril, naproxen  Assessment: 41 yof with 3 week history of RLL pain. Doppler positive to DVT. To start xarelto therapy. Baseline labs are pending but not significant abnormalities in the past.   Goal of Therapy:  Prevention of clot   Plan:  1. Xarelto 15mg  BID x 3 weeks then 20mg  daily 2. Will f/u baseline labs to ensure appropriate dosing  Keymari Sato, Drake Leach 01/16/2013,4:40 PM  Addendum: Pts baseline H/H is slightly low but plts are WNL. Also pts Scr is WNL so her renal function is adequate for xarelto dosing.  Plan: 1. Continue xarelto as ordered (15mg  BID x 3 week then 20mg  daily) 2. F/u S&S of bleeding and monitor labs as appropriate  Lysle Pearl, PharmD, BCPS Pager # [phone] 01/16/2013 5:34 PM

## 2013-01-16 NOTE — Progress Notes (Signed)
Bilateral lower extremity venous duplex completed.  Right:  DVT noted peroneal vein.  No evidence of superficial thrombosis.  No Baker's cyst.  Left:  No evidence of DVT, superficial thrombosis, or Baker's cyst.

## 2013-01-16 NOTE — ED Notes (Signed)
Waiting on Pharmacy to send medication
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
## 2013-03-03 ENCOUNTER — Other Ambulatory Visit (HOSPITAL_COMMUNITY): Payer: Self-pay | Admitting: Family Medicine

## 2013-03-03 DIAGNOSIS — M25561 Pain in right knee: Secondary | ICD-10-CM

## 2013-03-05 ENCOUNTER — Ambulatory Visit (HOSPITAL_COMMUNITY)
Admission: RE | Admit: 2013-03-05 | Discharge: 2013-03-05 | Disposition: A | Discharge status: Home / Self Care | Payer: No Typology Code available for payment source | Source: Ambulatory Visit | Attending: Family Medicine | Admitting: Family Medicine

## 2013-03-05 DIAGNOSIS — M25561 Pain in right knee: Secondary | ICD-10-CM

## 2013-03-05 DIAGNOSIS — M259 Joint disorder, unspecified: Secondary | ICD-10-CM | POA: Insufficient documentation

## 2013-03-05 DIAGNOSIS — M25569 Pain in unspecified knee: Secondary | ICD-10-CM | POA: Insufficient documentation

## 2013-03-05 DIAGNOSIS — M659 Unspecified synovitis and tenosynovitis, unspecified site: Secondary | ICD-10-CM | POA: Insufficient documentation
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
## 2018-06-24 ENCOUNTER — Ambulatory Visit (INDEPENDENT_AMBULATORY_CARE_PROVIDER_SITE_OTHER): Payer: Managed Care, Other (non HMO) | Admitting: Family Medicine

## 2018-06-24 ENCOUNTER — Encounter: Payer: Self-pay | Admitting: Family Medicine

## 2018-06-24 VITALS — BP 148/98 | HR 78 | Temp 98.8°F | Ht 68.0 in | Wt 196.0 lb

## 2018-06-24 DIAGNOSIS — Z7689 Persons encountering health services in other specified circumstances: Secondary | ICD-10-CM | POA: Diagnosis not present

## 2018-06-24 DIAGNOSIS — I10 Essential (primary) hypertension: Secondary | ICD-10-CM | POA: Diagnosis not present

## 2018-06-24 DIAGNOSIS — H6122 Impacted cerumen, left ear: Secondary | ICD-10-CM | POA: Diagnosis not present

## 2018-06-24 MED ORDER — LISINOPRIL 10 MG PO TABS: 10.0000 mg | ORAL_TABLET | Freq: Every day | ORAL | 2 refills | Status: DC

## 2018-06-24 NOTE — Patient Instructions (Signed)
Earwax Buildup, Adult The ears produce a substance called earwax that helps keep bacteria out of the ear and protects the skin in the ear canal. Occasionally, earwax can build up in the ear and cause discomfort or hearing loss. What increases the risk? This condition is more likely to develop in people who:  Are female.  Are elderly.  Naturally produce more earwax.  Clean their ears often with cotton swabs.  Use earplugs often.  Use in-ear headphones often.  Wear hearing aids.  Have narrow ear canals.  Have earwax that is overly thick or sticky.  Have eczema.  Are dehydrated.  Have excess hair in the ear canal.  What are the signs or symptoms? Symptoms of this condition include:  Reduced or muffled hearing.  A feeling of fullness in the ear or feeling that the ear is plugged.  Fluid coming from the ear.  Ear pain.  Ear itch.  Ringing in the ear.  Coughing.  An obvious piece of earwax that can be seen inside the ear canal.  How is this diagnosed? This condition may be diagnosed based on:  Your symptoms.  Your medical history.  An ear exam. During the exam, your health care provider will look into your ear with an instrument called an otoscope.  You may have tests, including a hearing test. How is this treated? This condition may be treated by:  Using ear drops to soften the earwax.  Having the earwax removed by a health care provider. The health care provider may: ? Flush the ear with water. ? Use an instrument that has a loop on the end (curette). ? Use a suction device.  Surgery to remove the wax buildup. This may be done in severe cases.  Follow these instructions at home:  Take over-the-counter and prescription medicines only as told by your health care provider.  Do not put any objects, including cotton swabs, into your ear. You can clean the opening of your ear canal with a washcloth or facial tissue.  Follow instructions from your health  care provider about cleaning your ears. Do not over-clean your ears.  Drink enough fluid to keep your urine clear or pale yellow. This will help to thin the earwax.  Keep all follow-up visits as told by your health care provider. If earwax builds up in your ears often or if you use hearing aids, consider seeing your health care provider for routine, preventive ear cleanings. Ask your health care provider how often you should schedule your cleanings.  If you have hearing aids, clean them according to instructions from the manufacturer and your health care provider. Contact a health care provider if:  You have ear pain.  You develop a fever.  You have blood, pus, or other fluid coming from your ear.  You have hearing loss.  You have ringing in your ears that does not go away.  Your symptoms do not improve with treatment.  You feel like the room is spinning (vertigo). Summary  Earwax can build up in the ear and cause discomfort or hearing loss.  The most common symptoms of this condition include reduced or muffled hearing and a feeling of fullness in the ear or feeling that the ear is plugged.  This condition may be diagnosed based on your symptoms, your medical history, and an ear exam.  This condition may be treated by using ear drops to soften the earwax or by having the earwax removed by a health care provider.  Do   not put any objects, including cotton swabs, into your ear. You can clean the opening of your ear canal with a washcloth or facial tissue. This information is not intended to replace advice given to you by your health care provider. Make sure you discuss any questions you have with your health care provider. Document Released: 09/28/2004 Document Revised: 11/01/2016 Document Reviewed: 11/01/2016 Elsevier Interactive Patient Education  2018 [employer].  Hypertension Hypertension, commonly called high blood pressure, is when the force of blood pumping through the  arteries is too strong. The arteries are the blood vessels that carry blood from the heart throughout the body. Hypertension forces the heart to work harder to pump blood and may cause arteries to become narrow or stiff. Having untreated or uncontrolled hypertension can cause heart attacks, strokes, kidney disease, and other problems. A blood pressure reading consists of a higher number over a lower number. Ideally, your blood pressure should be below 120/80. The first ("top") number is called the systolic pressure. It is a measure of the pressure in your arteries as your heart beats. The second ("bottom") number is called the diastolic pressure. It is a measure of the pressure in your arteries as the heart relaxes. What are the causes? The cause of this condition is not known. What increases the risk? Some risk factors for high blood pressure are under your control. Others are not. Factors you can change  Smoking.  Having type 2 diabetes mellitus, high cholesterol, or both.  Not getting enough exercise or physical activity.  Being overweight.  Having too much fat, sugar, calories, or salt (sodium) in your diet.  Drinking too much alcohol. Factors that are difficult or impossible to change  Having chronic kidney disease.  Having a family history of high blood pressure.  Age. Risk increases with age.  Race. You may be at higher risk if you are African-American.  Gender. Men are at higher risk than women before age 38. After age 66, women are at higher risk than men.  Having obstructive sleep apnea.  Stress. What are the signs or symptoms? Extremely high blood pressure (hypertensive crisis) may cause:  Headache.  Anxiety.  Shortness of breath.  Nosebleed.  Nausea and vomiting.  Severe chest pain.  Jerky movements you cannot control (seizures).  How is this diagnosed? This condition is diagnosed by measuring your blood pressure while you are seated, with your arm  resting on a surface. The cuff of the blood pressure monitor will be placed directly against the skin of your upper arm at the level of your heart. It should be measured at least twice using the same arm. Certain conditions can cause a difference in blood pressure between your right and left arms. Certain factors can cause blood pressure readings to be lower or higher than normal (elevated) for a short period of time:  When your blood pressure is higher when you are in a health care provider's office than when you are at home, this is called white coat hypertension. Most people with this condition do not need medicines.  When your blood pressure is higher at home than when you are in a health care provider's office, this is called masked hypertension. Most people with this condition may need medicines to control blood pressure.  If you have a high blood pressure reading during one visit or you have normal blood pressure with other risk factors:  You may be asked to return on a different day to have your blood pressure  checked again.  You may be asked to monitor your blood pressure at home for 1 week or longer.  If you are diagnosed with hypertension, you may have other blood or imaging tests to help your health care provider understand your overall risk for other conditions. How is this treated? This condition is treated by making healthy lifestyle changes, such as eating healthy foods, exercising more, and reducing your alcohol intake. Your health care provider may prescribe medicine if lifestyle changes are not enough to get your blood pressure under control, and if:  Your systolic blood pressure is above 130.  Your diastolic blood pressure is above 80.  Your personal target blood pressure may vary depending on your medical conditions, your age, and other factors. Follow these instructions at home: Eating and drinking  Eat a diet that is high in fiber and potassium, and low in sodium,  added sugar, and fat. An example eating plan is called the DASH (Dietary Approaches to Stop Hypertension) diet. To eat this way: ? Eat plenty of fresh fruits and vegetables. Try to fill half of your plate at each meal with fruits and vegetables. ? Eat whole grains, such as whole wheat pasta, brown rice, or whole grain bread. Fill about one quarter of your plate with whole grains. ? Eat or drink low-fat dairy products, such as skim milk or low-fat yogurt. ? Avoid fatty cuts of meat, processed or cured meats, and poultry with skin. Fill about one quarter of your plate with lean proteins, such as fish, chicken without skin, beans, eggs, and tofu. ? Avoid premade and processed foods. These tend to be higher in sodium, added sugar, and fat.  Reduce your daily sodium intake. Most people with hypertension should eat less than 1,500 mg of sodium a day.  Limit alcohol intake to no more than 1 drink a day for nonpregnant women and 2 drinks a day for men. One drink equals 12 oz of beer, 5 oz of wine, or 1 oz of hard liquor. Lifestyle  Work with your health care provider to maintain a healthy body weight or to lose weight. Ask what an ideal weight is for you.  Get at least 30 minutes of exercise that causes your heart to beat faster (aerobic exercise) most days of the week. Activities may include walking, swimming, or biking.  Include exercise to strengthen your muscles (resistance exercise), such as pilates or lifting weights, as part of your weekly exercise routine. Try to do these types of exercises for 30 minutes at least 3 days a week.  Do not use any products that contain nicotine or tobacco, such as cigarettes and e-cigarettes. If you need help quitting, ask your health care provider.  Monitor your blood pressure at home as told by your health care provider.  Keep all follow-up visits as told by your health care provider. This is important. Medicines  Take over-the-counter and prescription  medicines only as told by your health care provider. Follow directions carefully. Blood pressure medicines must be taken as prescribed.  Do not skip doses of blood pressure medicine. Doing this puts you at risk for problems and can make the medicine less effective.  Ask your health care provider about side effects or reactions to medicines that you should watch for. Contact a health care provider if:  You think you are having a reaction to a medicine you are taking.  You have headaches that keep coming back (recurring).  You feel dizzy.  You have swelling in  your ankles.  You have trouble with your vision. Get help right away if:  You develop a severe headache or confusion.  You have unusual weakness or numbness.  You feel faint.  You have severe pain in your chest or abdomen.  You vomit repeatedly.  You have trouble breathing. Summary  Hypertension is when the force of blood pumping through your arteries is too strong. If this condition is not controlled, it may put you at risk for serious complications.  Your personal target blood pressure may vary depending on your medical conditions, your age, and other factors. For most people, a normal blood pressure is less than 120/80.  Hypertension is treated with lifestyle changes, medicines, or a combination of both. Lifestyle changes include weight loss, eating a healthy, low-sodium diet, exercising more, and limiting alcohol. This information is not intended to replace advice given to you by your health care provider. Make sure you discuss any questions you have with your health care provider. Document Released: 08/21/2005 Document Revised: 07/19/2016 Document Reviewed: 07/19/2016 Elsevier Interactive Patient Education  2018 [employer].  How to Take Your Blood Pressure You can take your blood pressure at home with a machine. You may need to check your blood pressure at home:  To check if you have high blood pressure  (hypertension).  To check your blood pressure over time.  To make sure your blood pressure medicine is working.  Supplies needed: You will need a blood pressure machine, or monitor. You can buy one at a drugstore or online. When choosing one:  Choose one with an arm cuff.  Choose one that wraps around your upper arm. Only one finger should fit between your arm and the cuff.  Do not choose one that measures your blood pressure from your wrist or finger.  Your doctor can suggest a monitor. How to prepare Avoid these things for 30 minutes before checking your blood pressure:  Drinking caffeine.  Drinking alcohol.  Eating.  Smoking.  Exercising.  Five minutes before checking your blood pressure:  Pee.  Sit in a dining chair. Avoid sitting in a soft couch or armchair.  Be quiet. Do not talk.  How to take your blood pressure Follow the instructions that came with your machine. If you have a digital blood pressure monitor, these may be the instructions: 1. Sit up straight. 2. Place your feet on the floor. Do not cross your ankles or legs. 3. Rest your left arm at the level of your heart. You may rest it on a table, desk, or chair. 4. Pull up your shirt sleeve. 5. Wrap the blood pressure cuff around the upper part of your left arm. The cuff should be 1 inch (2.5 cm) above your elbow. It is best to wrap the cuff around bare skin. 6. Fit the cuff snugly around your arm. You should be able to place only one finger between the cuff and your arm. 7. Put the cord inside the groove of your elbow. 8. Press the power button. 9. Sit quietly while the cuff fills with air and loses air. 10. Write down the numbers on the screen. 11. Wait 2-3 minutes and then repeat steps 1-10.  What do the numbers mean? Two numbers make up your blood pressure. The first number is called systolic pressure. The second is called diastolic pressure. An example of a blood pressure reading is "120 over 80"  (or 120/80). If you are an adult and do not have a medical condition, use  this guide to find out if your blood pressure is normal: Normal  First number: below 120.  Second number: below 80. Elevated  First number: 120-129.  Second number: below 80. Hypertension stage 1  First number: 130-139.  Second number: 80-89. Hypertension stage 2  First number: 140 or above.  Second number: 40 or above. Your blood pressure is above normal even if only the top or bottom number is above normal. Follow these instructions at home:  Check your blood pressure as often as your doctor tells you to.  Take your monitor to your next doctor's appointment. Your doctor will: ? Make sure you are using it correctly. ? Make sure it is working right.  Make sure you understand what your blood pressure numbers should be.  Tell your doctor if your medicines are causing side effects. Contact a doctor if:  Your blood pressure keeps being high. Get help right away if:  Your first blood pressure number is higher than 180.  Your second blood pressure number is higher than 120. This information is not intended to replace advice given to you by your health care provider. Make sure you discuss any questions you have with your health care provider. Document Released: 08/03/2008 Document Revised: 07/19/2016 Document Reviewed: 01/28/2016 Elsevier Interactive Patient Education  [employer].

## 2018-06-24 NOTE — Progress Notes (Signed)
Patient presents to clinic today to follow-up on chronic issues and establish care.  SUBJECTIVE: PMH: Pt is a 47 yo female with pmh sig for HTN.  Pt previously seen at [facility].  Pt states she went without insurance for a few years, but recently got coverage.  HTN: -not on meds -was on lisinopril in the past -does not eat much salt a her husband cannot 2/2 his health. -pt does not eat pork, eats very little beef. -was walking for exercise -pt states her bp is always elevated when she comes to the office, but notes it was elevated at home prior to having dental work.  Past surgical history: -Breast reduction in 1991.  Social history: Pt is married.  She has 2 children a son 54 year old and a 81 year old daughter.  Pt works at [employer].  Pt endorses rare social alcohol use.  Pt denies drug use.  Family medical history: Dad-prostate cancer MGM-breast cancer  Health Maintenance: Vision --2014, [facility] [address] Mammogram --[address] PAP -- 2014  Past Medical History:  Diagnosis Date  . Chicken pox   . H/O blood clots   . Hypertension   . Rotator cuff tear    RIGHT    Past Surgical History:  Procedure Laterality Date  . BREAST REDUCTION SURGERY    . BREAST SURGERY    . KELOIDS REMOVED     FROM EARS  . SHOULDER OPEN ROTATOR CUFF REPAIR  01/18/2012   Procedure: ROTATOR CUFF REPAIR SHOULDER OPEN;  Surgeon: Jacki Cones, MD;  Location: [facility];  Service: Orthopedics;  Laterality: Right;  open acroinectomy with exploration rotater cuff right  . TUBAL LIGATION      Current Outpatient Medications on File Prior to Visit  Medication Sig Dispense Refill  . acetaminophen (TYLENOL) 500 MG tablet Take 500 mg by mouth every 6 (six) hours as needed for pain.      No current facility-administered medications on file prior to visit.     Allergies  Allergen Reactions  . Percocet [Oxycodone-Acetaminophen] Nausea And Vomiting    History reviewed. No  pertinent family history.  Social History   Socioeconomic History  . Marital status: Married    Spouse name: Not on file  . Number of children: Not on file  . Years of education: Not on file  . Highest education level: Not on file  Occupational History  . Not on file  Social Needs  . Financial resource strain: Not on file  . Food insecurity:    Worry: Not on file    Inability: Not on file  . Transportation needs:    Medical: Not on file    Non-medical: Not on file  Tobacco Use  . Smoking status: Current Every Day Smoker    Packs/day: 0.50  . Smokeless tobacco: Never Used  Substance and Sexual Activity  . Alcohol use: No  . Drug use: No  . Sexual activity: Yes  Lifestyle  . Physical activity:    Days per week: Not on file    Minutes per session: Not on file  . Stress: Not on file  Relationships  . Social connections:    Talks on phone: Not on file    Gets together: Not on file    Attends religious service: Not on file    Active member of club or organization: Not on file    Attends meetings of clubs or organizations: Not on file    Relationship status: Not on file  .  Intimate partner violence:    Fear of current or ex partner: Not on file    Emotionally abused: Not on file    Physically abused: Not on file    Forced sexual activity: Not on file  Other Topics Concern  . Not on file  Social History Narrative  . Not on file    ROS General: Denies fever, chills, night sweats, changes in weight, changes in appetite HEENT: Denies headaches, ear pain, changes in vision, rhinorrhea, sore throat CV: Denies CP, palpitations, SOB, orthopnea Pulm: Denies SOB, cough, wheezing GI: Denies abdominal pain, nausea, vomiting, diarrhea, constipation GU: Denies dysuria, hematuria, frequency, vaginal discharge Msk: Denies muscle cramps, joint pains Neuro: Denies weakness, numbness, tingling Skin: Denies rashes, bruising Psych: Denies depression, anxiety, hallucinations  BP  (!) 148/98 (BP Location: Left Arm, Patient Position: Sitting, Cuff Size: Normal)   Pulse 78   Temp 98.8 F (37.1 C) (Oral)   Ht 5\' 8"  (1.727 m)   Wt 196 lb (88.9 kg)   LMP 06/07/2018 (Exact Date)   SpO2 99%   BMI 29.80 kg/m   Physical Exam Gen. Pleasant, well developed, well-nourished, in NAD HEENT - [place]/AT, PERRL, no scleral icterus, no nasal drainage, pharynx without erythema or exudate.  Right TM normal.  Left occluded with cerumen. Lungs: no use of accessory muscles, no dullness to percussion, CTAB, no wheezes, rales or rhonchi Cardiovascular: RRR, No r/g/m, no peripheral edema Abdomen: BS present, soft, nontender,nondistended Neuro:  A&Ox3, CN II-XII intact, normal gait Skin:  Warm, dry, intact, no lesions  No results found for this or any previous visit (from the past 2160 hour(s)).  Assessment/Plan: Essential hypertension  -Elevated -Discussed restarting lisinopril 10 mg daily. -Patient encouraged to check BP at home and keep a log to bring with her to clinic. -Continue lifestyle modifications including increasing physical activity and decreasing sodium intake. - Plan: lisinopril (PRINIVIL,ZESTRIL) 10 MG tablet -Follow-up in 1 month for BP recheck.  Impacted cerumen of left ear -Consent obtained.  Left ear irrigated.  Patient tolerated procedure well -Given handout -Consider Debrox in the future.  Encounter to establish care -We reviewed the PMH, PSH, FH, SH, Meds and Allergies. -We provided refills for any medications we will prescribe as needed. -We addressed current concerns per orders and patient instructions. -We have asked for records for pertinent exams, studies, vaccines and notes from previous providers. -We have advised patient to follow up per instructions below.  F/u 1 month for bp  Abbe Amsterdam, MD

## 2018-07-25 ENCOUNTER — Encounter: Payer: Self-pay | Admitting: Family Medicine

## 2018-07-25 ENCOUNTER — Ambulatory Visit (INDEPENDENT_AMBULATORY_CARE_PROVIDER_SITE_OTHER): Payer: Managed Care, Other (non HMO) | Admitting: Family Medicine

## 2018-07-25 VITALS — BP 128/78 | HR 88 | Temp 98.3°F | Wt 195.0 lb

## 2018-07-25 DIAGNOSIS — R252 Cramp and spasm: Secondary | ICD-10-CM

## 2018-07-25 DIAGNOSIS — I10 Essential (primary) hypertension: Secondary | ICD-10-CM

## 2018-07-25 DIAGNOSIS — Z13220 Encounter for screening for lipoid disorders: Secondary | ICD-10-CM | POA: Diagnosis not present

## 2018-07-25 LAB — BASIC METABOLIC PANEL
BUN: 13 mg/dL (ref 6–23)
CALCIUM: 9.3 mg/dL (ref 8.4–10.5)
CO2: 24 mEq/L (ref 19–32)
CREATININE: 0.63 mg/dL (ref 0.40–1.20)
Chloride: 108 mEq/L (ref 96–112)
GFR: 129.48 mL/min (ref >60.00)
Glucose, Bld: 90 mg/dL (ref 70–99)
Potassium: 3.8 mEq/L (ref 3.5–5.1)
SODIUM: 141 meq/L (ref 135–145)

## 2018-07-25 LAB — LIPID PANEL
Cholesterol: 141 mg/dL (ref 0–200)
HDL: 53 mg/dL (ref >39.00)
LDL Cholesterol: 79 mg/dL (ref 0–99)
NonHDL: 87.83
TRIGLYCERIDES: 45 mg/dL (ref 0.0–149.0)
Total CHOL/HDL Ratio: 3
VLDL: 9 mg/dL (ref 0.0–40.0)

## 2018-07-25 MED ORDER — LISINOPRIL 20 MG PO TABS: 20.0000 mg | ORAL_TABLET | Freq: Every day | ORAL | 4 refills | Status: DC

## 2018-07-25 NOTE — Patient Instructions (Signed)
Hypertension Hypertension, commonly called high blood pressure, is when the force of blood pumping through the arteries is too strong. The arteries are the blood vessels that carry blood from the heart throughout the body. Hypertension forces the heart to work harder to pump blood and may cause arteries to become narrow or stiff. Having untreated or uncontrolled hypertension can cause heart attacks, strokes, kidney disease, and other problems. A blood pressure reading consists of a higher number over a lower number. Ideally, your blood pressure should be below 120/80. The first ("top") number is called the systolic pressure. It is a measure of the pressure in your arteries as your heart beats. The second ("bottom") number is called the diastolic pressure. It is a measure of the pressure in your arteries as the heart relaxes. What are the causes? The cause of this condition is not known. What increases the risk? Some risk factors for high blood pressure are under your control. Others are not. Factors you can change  Smoking.  Having type 2 diabetes mellitus, high cholesterol, or both.  Not getting enough exercise or physical activity.  Being overweight.  Having too much fat, sugar, calories, or salt (sodium) in your diet.  Drinking too much alcohol. Factors that are difficult or impossible to change  Having chronic kidney disease.  Having a family history of high blood pressure.  Age. Risk increases with age.  Race. You may be at higher risk if you are African-American.  Gender. Men are at higher risk than women before age 45. After age 65, women are at higher risk than men.  Having obstructive sleep apnea.  Stress. What are the signs or symptoms? Extremely high blood pressure (hypertensive crisis) may cause:  Headache.  Anxiety.  Shortness of breath.  Nosebleed.  Nausea and vomiting.  Severe chest pain.  Jerky movements you cannot control (seizures).  How is this  diagnosed? This condition is diagnosed by measuring your blood pressure while you are seated, with your arm resting on a surface. The cuff of the blood pressure monitor will be placed directly against the skin of your upper arm at the level of your heart. It should be measured at least twice using the same arm. Certain conditions can cause a difference in blood pressure between your right and left arms. Certain factors can cause blood pressure readings to be lower or higher than normal (elevated) for a short period of time:  When your blood pressure is higher when you are in a health care provider's office than when you are at home, this is called white coat hypertension. Most people with this condition do not need medicines.  When your blood pressure is higher at home than when you are in a health care provider's office, this is called masked hypertension. Most people with this condition may need medicines to control blood pressure.  If you have a high blood pressure reading during one visit or you have normal blood pressure with other risk factors:  You may be asked to return on a different day to have your blood pressure checked again.  You may be asked to monitor your blood pressure at home for 1 week or longer.  If you are diagnosed with hypertension, you may have other blood or imaging tests to help your health care provider understand your overall risk for other conditions. How is this treated? This condition is treated by making healthy lifestyle changes, such as eating healthy foods, exercising more, and reducing your alcohol intake. Your   health care provider may prescribe medicine if lifestyle changes are not enough to get your blood pressure under control, and if:  Your systolic blood pressure is above 130.  Your diastolic blood pressure is above 80.  Your personal target blood pressure may vary depending on your medical conditions, your age, and other factors. Follow these  instructions at home: Eating and drinking  Eat a diet that is high in fiber and potassium, and low in sodium, added sugar, and fat. An example eating plan is called the DASH (Dietary Approaches to Stop Hypertension) diet. To eat this way: ? Eat plenty of fresh fruits and vegetables. Try to fill half of your plate at each meal with fruits and vegetables. ? Eat whole grains, such as whole wheat pasta, brown rice, or whole grain bread. Fill about one quarter of your plate with whole grains. ? Eat or drink low-fat dairy products, such as skim milk or low-fat yogurt. ? Avoid fatty cuts of meat, processed or cured meats, and poultry with skin. Fill about one quarter of your plate with lean proteins, such as fish, chicken without skin, beans, eggs, and tofu. ? Avoid premade and processed foods. These tend to be higher in sodium, added sugar, and fat.  Reduce your daily sodium intake. Most people with hypertension should eat less than 1,500 mg of sodium a day.  Limit alcohol intake to no more than 1 drink a day for nonpregnant women and 2 drinks a day for men. One drink equals 12 oz of beer, 5 oz of wine, or 1 oz of hard liquor. Lifestyle  Work with your health care provider to maintain a healthy body weight or to lose weight. Ask what an ideal weight is for you.  Get at least 30 minutes of exercise that causes your heart to beat faster (aerobic exercise) most days of the week. Activities may include walking, swimming, or biking.  Include exercise to strengthen your muscles (resistance exercise), such as pilates or lifting weights, as part of your weekly exercise routine. Try to do these types of exercises for 30 minutes at least 3 days a week.  Do not use any products that contain nicotine or tobacco, such as cigarettes and e-cigarettes. If you need help quitting, ask your health care provider.  Monitor your blood pressure at home as told by your health care provider.  Keep all follow-up visits as  told by your health care provider. This is important. Medicines  Take over-the-counter and prescription medicines only as told by your health care provider. Follow directions carefully. Blood pressure medicines must be taken as prescribed.  Do not skip doses of blood pressure medicine. Doing this puts you at risk for problems and can make the medicine less effective.  Ask your health care provider about side effects or reactions to medicines that you should watch for. Contact a health care provider if:  You think you are having a reaction to a medicine you are taking.  You have headaches that keep coming back (recurring).  You feel dizzy.  You have swelling in your ankles.  You have trouble with your vision. Get help right away if:  You develop a severe headache or confusion.  You have unusual weakness or numbness.  You feel faint.  You have severe pain in your chest or abdomen.  You vomit repeatedly.  You have trouble breathing. Summary  Hypertension is when the force of blood pumping through your arteries is too strong. If this condition is not   controlled, it may put you at risk for serious complications.  Your personal target blood pressure may vary depending on your medical conditions, your age, and other factors. For most people, a normal blood pressure is less than 120/80.  Hypertension is treated with lifestyle changes, medicines, or a combination of both. Lifestyle changes include weight loss, eating a healthy, low-sodium diet, exercising more, and limiting alcohol. This information is not intended to replace advice given to you by your health care provider. Make sure you discuss any questions you have with your health care provider. Document Released: 08/21/2005 Document Revised: 07/19/2016 Document Reviewed: 07/19/2016 Elsevier Interactive Patient Education  2018 [employer]  Muscle Cramps and Spasms Muscle cramps and spasms occur when a muscle or muscles  tighten and you have no control over this tightening (involuntary muscle contraction). They are a common problem and can develop in any muscle. The most common place is in the calf muscles of the leg. Muscle cramps and muscle spasms are both involuntary muscle contractions, but there are some differences between the two:  Muscle cramps are painful. They come and go and may last a few seconds to 15 minutes. Muscle cramps are often more forceful and last longer than muscle spasms.  Muscle spasms may or may not be painful. They may also last just a few seconds or much longer.  Certain medical conditions, such as diabetes or Parkinson disease, can make it more likely to develop cramps or spasms. However, cramps or spasms are usually not caused by a serious underlying problem. Common causes include:  Overexertion.  Overuse from repetitive motions, or doing the same thing over and over.  Remaining in a certain position for a long period of time.  Improper preparation, form, or technique while playing a sport or doing an activity.  Dehydration.  Injury.  Side effects of some medicines.  Abnormally low levels of the salts and ions in your blood (electrolytes), especially potassium and calcium. This could happen if you are taking water pills (diuretics) or if you are pregnant.  In many cases, the cause of muscle cramps or spasms is unknown. Follow these instructions at home:  Stay well hydrated. Drink enough fluid to keep your urine clear or pale yellow.  Try massaging, stretching, and relaxing the affected muscle.  If directed, apply heat to tight or tense muscles as often as told by your health care provider. Use the heat source that your health care provider recommends, such as a moist heat pack or a heating pad. ? Place a towel between your skin and the heat source. ? Leave the heat on for 20-30 minutes. ? Remove the heat if your skin turns bright red. This is especially important if you  are unable to feel pain, heat, or cold. You may have a greater risk of getting burned.  If directed, put ice on the affected area. This may help if you are sore or have pain after a cramp or spasm. ? Put ice in a plastic bag. ? Place a towel between your skin and the bag. ? Leavethe ice on for 20 minutes, 2-3 times a day.  Take over-the-counter and prescription medicines only as told by your health care provider.  Pay attention to any changes in your symptoms. Contact a health care provider if:  Your cramps or spasms get more severe or happen more often.  Your cramps or spasms do not improve over time. This information is not intended to replace advice given to you by  your health care provider. Make sure you discuss any questions you have with your health care provider. Document Released: 02/10/2002 Document Revised: 09/22/2015 Document Reviewed: 05/25/2015 Elsevier Interactive Patient Education  2018 [employer].

## 2018-07-25 NOTE — Progress Notes (Signed)
Subjective:    Patient ID: Marlane Minglehequetha Lanier Brown, female    DOB: Sep 14, 1970, 47 y.o.   MRN: [id number]  No chief complaint on file.   HPI Patient was seen today for f/u on bp.  Pt restarted lisinopril 10 mg daily.  BP at home 152/92, 148/95, 148/92, 130/85, 144/89, 151/96, 160/100.  Notes checking bp in the am and prior to going to work.  Drinking 4 bottles of water per day.  Eats healthy as her husband is on a sodium restricted diet.  Endorses HA, bp was elevated at the time.  Pt is not exercising.  Notes muscle cramps in hands, legs, L elbow, and L knee.  At times the cramps wake her up at night.  Ran out of meds yesterday.  Past Medical History:  Diagnosis Date  . Chicken pox   . H/O blood clots   . Hypertension   . Rotator cuff tear    RIGHT    Allergies  Allergen Reactions  . Percocet [Oxycodone-Acetaminophen] Nausea And Vomiting    ROS General: Denies fever, chills, night sweats, changes in weight, changes in appetite HEENT: Denies ear pain, changes in vision, rhinorrhea, sore throat  +HAs CV: Denies CP, palpitations, SOB, orthopnea Pulm: Denies SOB, cough, wheezing GI: Denies abdominal pain, nausea, vomiting, diarrhea, constipation GU: Denies dysuria, hematuria, frequency, vaginal discharge Msk: Denies muscle cramps, joint pains  +muscle cramps Neuro: Denies weakness, numbness, tingling Skin: Denies rashes, bruising Psych: Denies depression, anxiety, hallucinations    Objective:    Blood pressure 128/78, pulse 88, temperature 98.3 F (36.8 C), temperature source Oral, weight 195 lb (88.5 kg), SpO2 98 %.  Gen. Pleasant, well-nourished, in no distress, normal affect   Lungs: no accessory muscle use, CTAB, no wheezes or rales Cardiovascular: RRR, no m/r/g, no peripheral edema Neuro:  A&Ox3, CN II-XII intact, normal gait Skin:  Warm, no lesions/ rash  Wt Readings from Last 3 Encounters:  06/24/18 196 lb (88.9 kg)  01/16/13 257 lb (116.6 kg)  01/18/12 232 lb 7 oz  (105.4 kg)    Lab Results  Component Value Date   WBC 6.8 01/16/2013   HGB 10.6 (L) 01/16/2013   HCT 33.4 (L) 01/16/2013   PLT 264 01/16/2013   GLUCOSE 89 01/16/2013   NA 137 01/16/2013   K 3.8 01/16/2013   CL 103 01/16/2013   CREATININE 0.67 01/16/2013   BUN 11 01/16/2013   CO2 27 01/16/2013    Assessment/Plan:  Essential hypertension  -controlled in office, but elevated at home. -discussed checking bp a different time. -continue lifestyle modifications -given handout - Plan: lisinopril (PRINIVIL,ZESTRIL) 20 MG tablet, Basic metabolic panel  Screening for cholesterol level  - Plan: Lipid panel  Muscle cramps  -discussed possible causes. -will check potassium -consider taking a teaspoon of yellow mustard each night. - Plan: Basic metabolic panel  F/u in 1 month  Abbe AmsterdamShannon Kenneth Lax, MD

## 2018-08-20 ENCOUNTER — Ambulatory Visit: Payer: Managed Care, Other (non HMO) | Admitting: Family Medicine

## 2018-12-29 ENCOUNTER — Other Ambulatory Visit: Payer: Self-pay | Admitting: Family Medicine

## 2018-12-29 DIAGNOSIS — I10 Essential (primary) hypertension: Secondary | ICD-10-CM

## 2018-12-31 ENCOUNTER — Other Ambulatory Visit: Payer: Self-pay

## 2018-12-31 ENCOUNTER — Telehealth: Payer: Self-pay | Admitting: Family Medicine

## 2018-12-31 DIAGNOSIS — I10 Essential (primary) hypertension: Secondary | ICD-10-CM

## 2018-12-31 MED ORDER — LISINOPRIL 20 MG PO TABS: 20.0000 mg | ORAL_TABLET | Freq: Every day | ORAL | 2 refills | Status: DC

## 2018-12-31 NOTE — Telephone Encounter (Signed)
Copied from CRM [id number]. Topic: Quick Communication - Rx Refill/Question >> Dec 31, 2018 10:14 AM Jaquita Rector A wrote: Medication: lisinopril (PRINIVIL,ZESTRIL) 20 MG tablet   Patient is completely out of her medication. Please refill  Has the patient contacted their pharmacy? Yes.   (Agent: If no, request that the patient contact the pharmacy for the refill.) (Agent: If yes, when and what did the pharmacy advise?)  Preferred Pharmacy (with phone number or street name): [facility] - [place], [place] - [address] [place] [phone] (Phone) [phone] (Fax)    Agent: Please be advised that RX refills may take up to 3 business days. We ask that you follow-up with your pharmacy.

## 2018-12-31 NOTE — Telephone Encounter (Signed)
Rx sent to pt pharmacy 

## 2019-04-02 ENCOUNTER — Other Ambulatory Visit: Payer: Self-pay | Admitting: Family Medicine

## 2019-04-02 DIAGNOSIS — I10 Essential (primary) hypertension: Secondary | ICD-10-CM

## 2019-04-24 ENCOUNTER — Ambulatory Visit (INDEPENDENT_AMBULATORY_CARE_PROVIDER_SITE_OTHER): Payer: Managed Care, Other (non HMO) | Admitting: Family Medicine

## 2019-04-24 ENCOUNTER — Other Ambulatory Visit: Payer: Managed Care, Other (non HMO)

## 2019-04-24 ENCOUNTER — Ambulatory Visit: Payer: Managed Care, Other (non HMO)

## 2019-04-24 ENCOUNTER — Ambulatory Visit (INDEPENDENT_AMBULATORY_CARE_PROVIDER_SITE_OTHER): Payer: Managed Care, Other (non HMO)

## 2019-04-24 ENCOUNTER — Encounter: Payer: Self-pay | Admitting: Family Medicine

## 2019-04-24 ENCOUNTER — Other Ambulatory Visit: Payer: Self-pay

## 2019-04-24 VITALS — BP 122/78 | HR 94 | Temp 98.6°F | Wt 214.0 lb

## 2019-04-24 DIAGNOSIS — M7702 Medial epicondylitis, left elbow: Secondary | ICD-10-CM | POA: Diagnosis not present

## 2019-04-24 DIAGNOSIS — M7701 Medial epicondylitis, right elbow: Secondary | ICD-10-CM | POA: Diagnosis not present

## 2019-04-24 DIAGNOSIS — M25562 Pain in left knee: Secondary | ICD-10-CM | POA: Diagnosis not present

## 2019-04-24 DIAGNOSIS — N939 Abnormal uterine and vaginal bleeding, unspecified: Secondary | ICD-10-CM

## 2019-04-24 DIAGNOSIS — Z13220 Encounter for screening for lipoid disorders: Secondary | ICD-10-CM

## 2019-04-24 DIAGNOSIS — Z0001 Encounter for general adult medical examination with abnormal findings: Secondary | ICD-10-CM

## 2019-04-24 DIAGNOSIS — G8929 Other chronic pain: Secondary | ICD-10-CM | POA: Diagnosis not present

## 2019-04-24 DIAGNOSIS — Z131 Encounter for screening for diabetes mellitus: Secondary | ICD-10-CM | POA: Diagnosis not present

## 2019-04-24 DIAGNOSIS — Z0000 Encounter for general adult medical examination without abnormal findings: Secondary | ICD-10-CM

## 2019-04-24 DIAGNOSIS — Z23 Encounter for immunization: Secondary | ICD-10-CM

## 2019-04-24 DIAGNOSIS — I10 Essential (primary) hypertension: Secondary | ICD-10-CM | POA: Diagnosis not present

## 2019-04-24 DIAGNOSIS — N941 Unspecified dyspareunia: Secondary | ICD-10-CM

## 2019-04-24 LAB — COMPREHENSIVE METABOLIC PANEL
ALT: 11 U/L (ref 0–35)
AST: 14 U/L (ref 0–37)
Albumin: 4.2 g/dL (ref 3.5–5.2)
Alkaline Phosphatase: 56 U/L (ref 39–117)
BUN: 16 mg/dL (ref 6–23)
CO2: 22 mEq/L (ref 19–32)
Calcium: 9.2 mg/dL (ref 8.4–10.5)
Chloride: 107 mEq/L (ref 96–112)
Creatinine, Ser: 0.69 mg/dL (ref 0.40–1.20)
GFR: 109.34 mL/min (ref >60.00)
Glucose, Bld: 85 mg/dL (ref 70–99)
Potassium: 4.3 mEq/L (ref 3.5–5.1)
Sodium: 138 mEq/L (ref 135–145)
Total Bilirubin: 0.4 mg/dL (ref 0.2–1.2)
Total Protein: 6.9 g/dL (ref 6.0–8.3)

## 2019-04-24 LAB — CBC WITH DIFFERENTIAL/PLATELET
Basophils Absolute: 0.1 10*3/uL (ref 0.0–0.1)
Basophils Relative: 2.7 % (ref 0.0–3.0)
Eosinophils Absolute: 0 10*3/uL (ref 0.0–0.7)
Eosinophils Relative: 0.7 % (ref 0.0–5.0)
HCT: 30.5 % — ABNORMAL LOW (ref 36.0–46.0)
Hemoglobin: 9.2 g/dL — ABNORMAL LOW (ref 12.0–15.0)
Lymphocytes Relative: 26.4 % (ref 12.0–46.0)
Lymphs Abs: 1.3 10*3/uL (ref 0.7–4.0)
MCHC: 30.1 g/dL (ref 30.0–36.0)
MCV: 62.4 fl — ABNORMAL LOW (ref 78.0–100.0)
Monocytes Absolute: 0.8 10*3/uL (ref 0.1–1.0)
Monocytes Relative: 16.3 % — ABNORMAL HIGH (ref 3.0–12.0)
Neutro Abs: 2.6 10*3/uL (ref 1.4–7.7)
Neutrophils Relative %: 53.9 % (ref 43.0–77.0)
Platelets: 318 10*3/uL (ref 150.0–400.0)
RBC: 4.88 Mil/uL (ref 3.87–5.11)
RDW: 22.2 % — ABNORMAL HIGH (ref 11.5–15.5)
WBC: 4.8 10*3/uL (ref 4.0–10.5)

## 2019-04-24 LAB — LIPID PANEL
Cholesterol: 172 mg/dL (ref 0–200)
HDL: 59.7 mg/dL (ref >39.00)
LDL Cholesterol: 102 mg/dL — ABNORMAL HIGH (ref 0–99)
NonHDL: 112.37
Total CHOL/HDL Ratio: 3
Triglycerides: 52 mg/dL (ref 0.0–149.0)
VLDL: 10.4 mg/dL (ref 0.0–40.0)

## 2019-04-24 LAB — HEMOGLOBIN A1C: Hgb A1c MFr Bld: 5.5 % (ref 4.6–6.5)

## 2019-04-24 LAB — TSH: TSH: 0.88 u[IU]/mL (ref 0.35–4.50)

## 2019-04-24 LAB — T4, FREE: Free T4: 0.77 ng/dL (ref 0.60–1.60)

## 2019-04-24 NOTE — Patient Instructions (Addendum)
You can try Voltaren gel over-the-counter at your local drugstore, [facility], [employer].  You can use this as needed for your knee pain. Preventive Care 38-49 Years Old, Female Preventive care refers to visits with your health care provider and lifestyle choices that can promote health and wellness. This includes:  A yearly physical exam. This may also be called an annual well check.  Regular dental visits and eye exams.  Immunizations.  Screening for certain conditions.  Healthy lifestyle choices, such as eating a healthy diet, getting regular exercise, not using drugs or products that contain nicotine and tobacco, and limiting alcohol use. What can I expect for my preventive care visit? Physical exam Your health care provider will check your:  Height and weight. This may be used to calculate body mass index (BMI), which tells if you are at a healthy weight.  Heart rate and blood pressure.  Skin for abnormal spots. Counseling Your health care provider may ask you questions about your:  Alcohol, tobacco, and drug use.  Emotional well-being.  Home and relationship well-being.  Sexual activity.  Eating habits.  Work and work Statistician.  Method of birth control.  Menstrual cycle.  Pregnancy history. What immunizations do I need?  Influenza (flu) vaccine  This is recommended every year. Tetanus, diphtheria, and pertussis (Tdap) vaccine  You may need a Td booster every 10 years. Varicella (chickenpox) vaccine  You may need this if you have not been vaccinated. Zoster (shingles) vaccine  You may need this after age 46. Measles, mumps, and rubella (MMR) vaccine  You may need at least one dose of MMR if you were born in 1957 or later. You may also need a second dose. Pneumococcal conjugate (PCV13) vaccine  You may need this if you have certain conditions and were not previously vaccinated. Pneumococcal polysaccharide (PPSV23) vaccine  You may need one or two doses  if you smoke cigarettes or if you have certain conditions. Meningococcal conjugate (MenACWY) vaccine  You may need this if you have certain conditions. Hepatitis A vaccine  You may need this if you have certain conditions or if you travel or work in places where you may be exposed to hepatitis A. Hepatitis B vaccine  You may need this if you have certain conditions or if you travel or work in places where you may be exposed to hepatitis B. Haemophilus influenzae type b (Hib) vaccine  You may need this if you have certain conditions. Human papillomavirus (HPV) vaccine  If recommended by your health care provider, you may need three doses over 6 months. You may receive vaccines as individual doses or as more than one vaccine together in one shot (combination vaccines). Talk with your health care provider about the risks and benefits of combination vaccines. What tests do I need? Blood tests  Lipid and cholesterol levels. These may be checked every 5 years, or more frequently if you are over 67 years old.  Hepatitis C test.  Hepatitis B test. Screening  Lung cancer screening. You may have this screening every year starting at age 72 if you have a 30-pack-year history of smoking and currently smoke or have quit within the past 15 years.  Colorectal cancer screening. All adults should have this screening starting at age 47 and continuing until age 73. Your health care provider may recommend screening at age 37 if you are at increased risk. You will have tests every 1-10 years, depending on your results and the type of screening test.  Diabetes  screening. This is done by checking your blood sugar (glucose) after you have not eaten for a while (fasting). You may have this done every 1-3 years.  Mammogram. This may be done every 1-2 years. Talk with your health care provider about when you should start having regular mammograms. This may depend on whether you have a family history of breast  cancer.  BRCA-related cancer screening. This may be done if you have a family history of breast, ovarian, tubal, or peritoneal cancers.  Pelvic exam and Pap test. This may be done every 3 years starting at age 43. Starting at age 40, this may be done every 5 years if you have a Pap test in combination with an HPV test. Other tests  Sexually transmitted disease (STD) testing.  Bone density scan. This is done to screen for osteoporosis. You may have this scan if you are at high risk for osteoporosis. Follow these instructions at home: Eating and drinking  Eat a diet that includes fresh fruits and vegetables, whole grains, lean protein, and low-fat dairy.  Take vitamin and mineral supplements as recommended by your health care provider.  Do not drink alcohol if: ? Your health care provider tells you not to drink. ? You are pregnant, may be pregnant, or are planning to become pregnant.  If you drink alcohol: ? Limit how much you have to 0-1 drink a day. ? Be aware of how much alcohol is in your drink. In the [place] one drink equals one 12 oz bottle of beer (355 mL), one 5 oz glass of wine (148 mL), or one 1 oz glass of hard liquor (44 mL). Lifestyle  Take daily care of your teeth and gums.  Stay active. Exercise for at least 30 minutes on 5 or more days each week.  Do not use any products that contain nicotine or tobacco, such as cigarettes, e-cigarettes, and chewing tobacco. If you need help quitting, ask your health care provider.  If you are sexually active, practice safe sex. Use a condom or other form of birth control (contraception) in order to prevent pregnancy and STIs (sexually transmitted infections).  If told by your health care provider, take low-dose aspirin daily starting at age 28. What's next?  Visit your health care provider once a year for a well check visit.  Ask your health care provider how often you should have your eyes and teeth checked.  Stay up to date  on all vaccines. This information is not intended to replace advice given to you by your health care provider. Make sure you discuss any questions you have with your health care provider. Document Released: 09/17/2015 Document Revised: 05/02/2018 Document Reviewed: 05/02/2018 Elsevier Patient Education  2020 [place]  Golfer's elbow, also called medial epicondylitis, is a condition that results from inflammation of the strong bands of tissue (tendons) that attach your forearm muscles to the inside of your bone at the elbow. These tendons affect the muscles that bend the palm toward the wrist (flexion). The tendons become less flexible with age. This condition is called golfer's elbow because it is more common among people who constantly bend and twist their wrists, such as golfers. This injury is usually caused by overuse. What are the causes? This condition is caused by:  Repeatedly flexing, turning, or twisting your wrist.  Constantly gripping objects with your hands. What increases the risk? This condition is more likely to develop in people who play golf, baseball, or tennis. This  injury is more common among people who have jobs that require the constant use of their hands, such as:  Carpenters.  Butchers.  Musicians.  Typists. What are the signs or symptoms? This condition causes elbow pain that may spread to your forearm and upper arm. Symptoms of this condition include.  Pain at the inner elbow, forearm, or wrist.  Reduced grip strength. The pain may get worse when you bend your wrist downward. How is this diagnosed? This condition is diagnosed based on your symptoms, medical history, and a physical exam. During the exam, your health care provider may:  Test your grip strength.  Move your wrist to check for pain. You may also have an MRI to:  Confirm the diagnosis.  Look for other issues.  Check for tears in the ligaments, muscles, or  tendons. How is this treated? Treatment for this condition includes:  Stopping all activities that make you bend or twist your elbow or wrist and waiting until your pain and other symptoms go away before resuming those activities.  Wearing an elbow brace or wrist splint to restrict the movements that cause symptoms.  Icing your inner elbow, forearm, or wrist to relieve pain.  Taking NSAIDs or getting corticosteroid injections to reduce pain and swelling.  Doing stretching, range-of-motion, and strengthening exercises (physical therapy) as told by your health care provider. In rare cases, surgery may be needed if your condition does not improve. Follow these instructions at home: If you have a brace or splint:  Wear it as told by your health care provider.  Loosen it if your fingers tingle, become numb, or turn cold and blue.  Keep it clean. Managing pain, stiffness, and swelling   If directed, put ice on the injured area. ? Put ice in a plastic bag. ? Place a towel between your skin and the bag. ? Leave the ice on for 20 minutes, 2-3 times a day.  Move your fingers often to avoid stiffness.  Raise (elevate) the injured area above the level of your heart while you are sitting or lying down. Activity  Rest your injured area as told by your health care provider.  Return to your normal activities as told by your health care provider. Ask your health care provider what activities are safe for you.  Do exercises as told by your health care provider. Lifestyle  If your condition is caused by sports, work with a trainer to make sure that you: ? Have the correct technique. ? Are using the proper equipment.  If your condition is work related, talk with your employer about changes that can be made. General instructions  Take over-the-counter and prescription medicines only as told by your health care provider.  Do not use any products that contain nicotine or tobacco, such as  cigarettes, e-cigarettes, and chewing tobacco. If you need help quitting, ask your health care provider.  Keep all follow-up visits as told by your health care provider. This is important. How is this prevented?  Before and after activity: ? Warm up and stretch before being active. ? Cool down and stretch after being active. ? Give your body time to rest between periods of activity.  During activity: ? Make sure to use equipment that fits you. ? If you play golf, slow your golf swing to reduce shock in the arm when making contact with the ball.  Maintain physical fitness, including: ? Strength. ? Flexibility. ? Cardiovascular fitness. ? Endurance.  Do exercises to strengthen the forearm  muscles. Contact a health care provider if:  Your pain does not improve or it gets worse.  You notice numbness in your hand. Get help right away if:  Your pain is severe.  You cannot move your wrist. Summary  Golfer's elbow, also called medial epicondylitis, is a condition that results from inflammation of the strong bands of tissue (tendons) that attach your forearm muscles to the inside of your bone at the elbow.  This injury usually results from overuse.  Symptoms of this condition include decreased grip strength and pain at the inner elbow, forearm, or wrist.  This injury is treated with rest, ice, medicines, physical therapy, and surgery as needed. This information is not intended to replace advice given to you by your health care provider. Make sure you discuss any questions you have with your health care provider. Document Released: 08/21/2005 Document Revised: 12/12/2018 Document Reviewed: 06/27/2018 Elsevier Patient Education  [place] Ask your health care provider which exercises are safe for you. Do exercises exactly as told by your health care provider and adjust them as directed. It is normal to feel mild stretching, pulling, tightness, or  discomfort as you do these exercises. Stop right away if you feel sudden pain or your pain gets worse. Do not begin these exercises until told by your health care provider. Stretching and range-of-motion exercises These exercises warm up your muscles and joints and improve the movement and flexibility of your elbow. Wrist extension  1. Straighten your left / right elbow in front of you with your palm facing up toward the ceiling. ? If told by your health care provider, bend your left / right elbow to a 90-degree angle (right angle) at your side. 2. With your other hand, gently pull your left / right hand and fingers toward the floor (extension). Stop when you feel a gentle stretch on the palm side of your forearm. 3. Hold this position for __________ seconds. Repeat __________ times. Complete this exercise __________ times a day. Wrist flexion  1. Straighten your left / right elbow in front of you with your palm facing down toward the floor. ? If told by your health care provider, bend your left / right elbow to a 90-degree angle (right angle) at your side. 2. With your other hand, gently push over the back of your left / right hand so your fingers point toward the floor (flexion). Stop when you feel a gentle stretch on the back of your forearm. 3. Hold this position for __________ seconds. Repeat __________ times. Complete this exercise __________ times a day. Forearm rotation, supination 1. Sit or stand with your elbows at your side. 2. Bend your left / right elbow to a 90-degree angle (right angle). 3. Using your uninjured hand, turn your left / right palm up toward the ceiling (supination) until you feel a gentle stretch along the inside of your forearm. 4. Hold this position for __________ seconds. Repeat __________ times. Complete this exercise __________ times a day. Forearm rotation, pronation 1. Sit or stand with your elbows at your side. 2. Bend your left / right elbow to a  90-degree angle (right angle). 3. Using your uninjured hand, turn your left / right palm down toward the floor (pronation) until you feel a gentle stretch along the top of your forearm. 4. Hold this position for __________ seconds. Repeat __________ times. Complete this exercise __________ times a day. Strengthening exercises These exercises build strength and endurance in your  elbow. Endurance is the ability to use your muscles for a long time, even after they get tired. Wrist flexion  1. Sit with your left / right forearm supported on a table or other surface and your palm turned up toward the ceiling. Let your left / right wrist extend over the edge of the surface. 2. Hold a __________ weight or a piece of rubber exercise band or tubing. ? If using a rubber exercise band or tubing, hold the other end of the tubing with your other hand. 3. Slowly bend your wrist so your hand moves up toward the ceiling (flexion). Try to only move your wrist and keep the rest of your arm still. 4. Hold this position for __________ seconds. 5. Slowly return to the starting position. Repeat __________ times. Complete this exercise __________ times a day. Wrist flexion, eccentric 1. Sit with your left / right forearm palm-up and supported on a table or other surface. Let your left / right wrist extend over the edge of the surface. 2. Hold a __________ weight or a piece of rubber exercise band or tubing in your left / right hand. ? If using a rubber exercise band or tubing, hold the other end of the tubing with your other hand. 3. Use your uninjured hand to move your left / right hand up toward the ceiling. 4. Take your uninjured hand away and slowly return to the starting position using only your left / right hand (eccentric flexion). Repeat __________ times. Complete this exercise __________ times a day. Forearm rotation, pronation To do this exercise, you will need a lightweight hammer or rubber  mallet. 1. Sit with your left / right forearm supported on a table or other surface. Bend your elbow to a 90-degree angle (right angle). Position your forearm so that your palm is facing up toward the ceiling, with your hand resting over the edge of the table. 2. Hold a hammer in your left / right hand. ? To make this exercise easier, hold the hammer near the head of the hammer. ? To make this exercise harder, hold the hammer near the end of the handle. 3. Without moving your elbow, slowly turn (rotate) your forearm so your palm faces down toward the floor (pronation). 4. Hold this position for __________ seconds. 5. Slowly return to the starting position. Repeat __________ times. Complete this exercise __________ times a day. Shoulder blade squeeze 1. Sit in a stable chair or stand with good posture. If you are sitting down, do not let your back touch the back of the chair. 2. Your arms should be at your sides with your elbows bent to a 90-degree angle (right angle). Position your forearms so that your thumbs are facing the ceiling (neutral position). 3. Without lifting your shoulders up, squeeze your shoulder blades tightly together. 4. Hold this position for __________ seconds. 5. Slowly release and return to the starting position. Repeat __________ times. Complete this exercise __________ times a day. This information is not intended to replace advice given to you by your health care provider. Make sure you discuss any questions you have with your health care provider. Document Released: 08/21/2005 Document Revised: 12/12/2018 Document Reviewed: 10/15/2018 Elsevier Patient Education  [place].  Chronic Knee Pain, Adult Chronic knee pain is pain in one or both knees that lasts longer than 3 months. Symptoms of chronic knee pain may include swelling, stiffness, and discomfort. Age-related wear and tear (osteoarthritis) of the knee joint is the most common  cause of chronic knee pain.  Other possible causes include:  A long-term immune-related disease that causes inflammation of the knee (rheumatoid arthritis). This usually affects both knees.  Inflammatory arthritis, such as gout or pseudogout.  An injury to the knee that causes arthritis.  An injury to the knee that damages the ligaments. Ligaments are strong tissues that connect bones to each other.  Runner's knee or pain behind the kneecap. Treatment for chronic knee pain depends on the cause. The main treatments for chronic knee pain are physical therapy and weight loss. This condition may also be treated with medicines, injections, a knee sleeve or brace, and by using crutches. Rest, ice, compression (pressure), and elevation (RICE) therapy may also be recommended. Follow these instructions at home: If you have a knee sleeve or brace:   Wear it as told by your health care provider. Remove it only as told by your health care provider.  Loosen it if your toes tingle, become numb, or turn cold and blue.  Keep it clean.  If the sleeve or brace is not waterproof: ? Do not let it get wet. ? Remove it if allowed by your health care provider, or cover it with a watertight covering when you take a bath or a shower. Managing pain, stiffness, and swelling      If directed, apply heat to the affected area as often as told by your health care provider. Use the heat source that your health care provider recommends, such as a moist heat pack or a heating pad. ? If you have a removable sleeve or brace, remove it as told by your health care provider. ? Place a towel between your skin and the heat source. ? Leave the heat on for 20-30 minutes. ? Remove the heat if your skin turns bright red. This is especially important if you are unable to feel pain, heat, or cold. You may have a greater risk of getting burned.  If directed, put ice on the affected area. ? If you have a removable sleeve or brace, remove it as told by your  health care provider. ? Put ice in a plastic bag. ? Place a towel between your skin and the bag. ? Leave the ice on for 20 minutes, 2-3 times a day.  Move your toes often to reduce stiffness and swelling.  Raise (elevate) the injured area above the level of your heart while you are sitting or lying down. Activity  Avoid activities where both feet leave the ground at the same time (high-impact activities). Examples are running, jumping rope, and doing jumping jacks.  Return to your normal activities as told by your health care provider. Ask your health care provider what activities are safe for you.  Follow the exercise plan that your health care provider designed for you. Your health care provider may suggest that you: ? Avoid activities that make knee pain worse. This may require you to change your exercise routines, sport participation, or job duties. ? Wear shoes with cushioned soles. ? Avoid high-impact activities or sports that require running and sudden changes in direction. ? Do physical therapy as told by your health care provider. Physical therapy is planned to match your needs and abilities. It may include exercises for strength, flexibility, stability, and endurance. ? Do exercises that increase balance and strength, such as tai chi and yoga.  Do not use the injured limb to support your body weight until your health care provider says that you can. Use  crutches, a cane, or a walker, as told by your health care provider. General instructions  Take over-the-counter and prescription medicines only as told by your health care provider.  Lose weight if you are overweight. Losing even a little weight can reduce knee pain. Ask your health care provider what your ideal weight is, and how to safely lose extra weight. A food expert (dietitian) may be able to help you plan your meals.  Do not use any products that contain nicotine or tobacco, such as cigarettes, e-cigarettes, and chewing  tobacco. These can delay healing. If you need help quitting, ask your health care provider.  Keep all follow-up visits as told by your health care provider. This is important. Contact a health care provider if:  You have knee pain that is not getting better or gets worse.  You are unable to do your physical therapy exercises due to knee pain. Get help right away if:  Your knee swells and the swelling becomes worse.  You cannot move your knee.  You have severe knee pain. Summary  Knee pain that lasts more than 3 months is considered chronic knee pain.  The main treatments for chronic knee pain are physical therapy and weight loss. You may also need to take medicines, wear a knee sleeve or brace, use crutches, and apply ice or heat.  Losing even a little weight can reduce knee pain. Ask your health care provider what your ideal weight is, and how to safely lose extra weight. A food expert (dietitian) may be able to help you plan your meals.  Work with a physical therapist to make a safe exercise program, as told by your health care provider. This information is not intended to replace advice given to you by your health care provider. Make sure you discuss any questions you have with your health care provider. Document Released: 10/31/2018 Document Revised: 10/31/2018 Document Reviewed: 10/31/2018 Elsevier Patient Education  [place].  Abnormal Uterine Bleeding Abnormal uterine bleeding is unusual bleeding from the uterus. It includes:  Bleeding or spotting between periods.  Bleeding after sex.  Bleeding that is heavier than normal.  Periods that last longer than usual.  Bleeding after menopause. Abnormal uterine bleeding can affect women at various stages in life, including teenagers, women in their reproductive years, pregnant women, and women who have reached menopause. Common causes of abnormal uterine bleeding include:  Pregnancy.  Growths of tissue  (polyps).  A noncancerous tumor in the uterus (fibroid).  Infection.  Cancer.  Hormonal imbalances. Any type of abnormal bleeding should be evaluated by a health care provider. Many cases are minor and simple to treat, while others are more serious. Treatment will depend on the cause of the bleeding. Follow these instructions at home:  Monitor your condition for any changes.  Do not use tampons, douche, or have sex if told by your health care provider.  Change your pads often.  Get regular exams that include pelvic exams and cervical cancer screening.  Keep all follow-up visits as told by your health care provider. This is important. Contact a health care provider if:  Your bleeding lasts for more than one week.  You feel dizzy at times.  You feel nauseous or you vomit. Get help right away if:  You pass out.  Your bleeding soaks through a pad every hour.  You have abdominal pain.  You have a fever.  You become sweaty or weak.  You pass large blood clots from your vagina.  Summary  Abnormal uterine bleeding is unusual bleeding from the uterus.  Any type of abnormal bleeding should be evaluated by a health care provider. Many cases are minor and simple to treat, while others are more serious.  Treatment will depend on the cause of the bleeding. This information is not intended to replace advice given to you by your health care provider. Make sure you discuss any questions you have with your health care provider. Document Released: 08/21/2005 Document Revised: 11/28/2017 Document Reviewed: 09/22/2016 Elsevier Patient Education  2020 [employer].

## 2019-04-24 NOTE — Progress Notes (Signed)
Subjective:     Cathy Brown is a 48 y.o. female and is here for a comprehensive physical exam. The patient reports problems - elbow and L knee pain, AUB.  Pt works at [employer] where she does a lot of standing on a concrete floor.  Pt notes stiffness with prolonges sitting, weak at times and tingling in knee.  Pt with soreness in b/l elbows.  Started last yr.  If sleeping and develops pain will have to stretch out arms.  Has edema at times in hands.  Menses increasing in duration.  May last 15 days then restart or be continuous x 7-10 days.  Irregular bleeding started in Feb.  Pt with dyspareunia. Denies pelvic heaviness, increased bloating, or constipation.  Pt has not had a mammogram.  Social History   Socioeconomic History  . Marital status: Married    Spouse name: Not on file  . Number of children: Not on file  . Years of education: Not on file  . Highest education level: Not on file  Occupational History  . Not on file  Social Needs  . Financial resource strain: Not on file  . Food insecurity    Worry: Not on file    Inability: Not on file  . Transportation needs    Medical: Not on file    Non-medical: Not on file  Tobacco Use  . Smoking status: Current Every Day Smoker    Packs/day: 0.50  . Smokeless tobacco: Never Used  Substance and Sexual Activity  . Alcohol use: No  . Drug use: No  . Sexual activity: Yes  Lifestyle  . Physical activity    Days per week: Not on file    Minutes per session: Not on file  . Stress: Not on file  Relationships  . Social Herbalist on phone: Not on file    Gets together: Not on file    Attends religious service: Not on file    Active member of club or organization: Not on file    Attends meetings of clubs or organizations: Not on file    Relationship status: Not on file  . Intimate partner violence    Fear of current or ex partner: Not on file    Emotionally abused: Not on file    Physically abused: Not on  file    Forced sexual activity: Not on file  Other Topics Concern  . Not on file  Social History Narrative  . Not on file   Health Maintenance  Topic Date Due  . HIV Screening  02/19/1985  . TETANUS/TDAP  02/03/2012  . PAP SMEAR-Modifier  06/03/2016  . INFLUENZA VACCINE  04/05/2019    The following portions of the patient's history were reviewed and updated as appropriate: allergies, current medications, past family history, past medical history, past social history, past surgical history and problem list.  Review of Systems Pertinent items noted in HPI and remainder of comprehensive ROS otherwise negative.   Objective:    BP 122/78   Pulse 94   Temp 98.6 F (37 C)   Wt 214 lb (97.1 kg)   LMP 04/22/2019   SpO2 98%   BMI 32.54 kg/m  General appearance: alert, cooperative and no distress Head: Normocephalic, without obvious abnormality, atraumatic Eyes: conjunctivae/corneas clear. PERRL, EOM's intact. Fundi benign. Ears: normal TM's and external ear canals both ears Nose: Nares normal. Septum midline. Mucosa normal. No drainage or sinus tenderness. Throat: lips, mucosa, and tongue normal;  teeth and gums normal Neck: moderate anterior cervical adenopathy, no adenopathy, no carotid bruit, no JVD, supple, symmetrical, trachea midline and thyroid not enlarged, symmetric, no tenderness/mass/nodules Lungs: clear to auscultation bilaterally Heart: normal apical impulse Abdomen: soft, non-tender; bowel sounds normal; no masses,  no organomegaly Extremities: extremities normal, atraumatic, no cyanosis or edema  No deformities noted. L knee with mild TTP in joint line of posterior knee, no crepitus.  Mild TTP of medial L and R elbows.  No TTP of b/l wrist or hands.  No edema.  Negative Tinnel's and Phalen's. Pulses: 2+ and symmetric Skin: Skin color, texture, turgor normal. No rashes or lesions Lymph nodes: Cervical, supraclavicular, and axillary nodes normal. Neurologic: Alert and  oriented X 3, normal strength and tone. Normal symmetric reflexes. Normal coordination and gait    Assessment:    Healthy female exam with pain in multiple joint      Plan:     Anticipatory guidance given including wearing seatbelts, smoke detectors in the home, increasing physical activity, increasing p.o. intake of water and vegetables. -will obtain labs -discussed need for pap.  Pt considering OB/Gyn -discussed need to schedule mammogram -given handout -next CPE in 1 yr See After Visit Summary for Counseling Recommendations    Essential hypertension  -continue lisinopril 20 mg - Plan: Comprehensive metabolic panel  Dyspareunia, female  - Plan: US PELVIC COMPLETE WITH TRANSVAGINAL  Medial epicondylitis of left elbow -rest, Ice, NSAIDs  Medial epicondylitis of elbow, right -discussed rest, ice, NASAIDs  Chronic pain of left knee -will try OTC Voltaren gel - Plan: CBC with Differential/Platelet, DG Knee Complete 4 Views Left  Screening for cholesterol level  - Plan: Lipid panel  Screening for diabetes mellitus  - Plan: Hemoglobin A1c  Abnormal uterine bleeding (AUB)  -discussed need for pap.  Pt considering OB/Gyn. - Plan: TSH, T4, Free, CBC     ---Pt anemic, hgb 9.2.  Will start iron.  Given precautions.  Need for influenza vaccination  - Plan: Flu Vaccine QUAD 36+ mos IM   F/u prn  Abbe AmsterdamShannon Mirca Yale, MD

## 2019-04-28 ENCOUNTER — Other Ambulatory Visit: Payer: Self-pay | Admitting: Family Medicine

## 2019-04-28 DIAGNOSIS — I10 Essential (primary) hypertension: Secondary | ICD-10-CM

## 2019-04-28 MED ORDER — FERROUS SULFATE 325 (65 FE) MG PO TBEC: 325.0000 mg | DELAYED_RELEASE_TABLET | Freq: Two times a day (BID) | ORAL | 3 refills | Status: AC

## 2019-04-28 NOTE — Telephone Encounter (Signed)
Copied from [place] [id number]. Topic: General - Inquiry >> Apr 28, 2019 12:06 PM Alanda Slim E wrote: Reason for CRM: Pt would like a call to go over imaging results from her Knee xray / please advise

## 2019-04-29 ENCOUNTER — Ambulatory Visit
Admission: RE | Admit: 2019-04-29 | Discharge: 2019-04-29 | Disposition: A | Discharge status: Home / Self Care | Payer: Managed Care, Other (non HMO) | Source: Ambulatory Visit | Attending: Family Medicine | Admitting: Family Medicine

## 2019-04-29 DIAGNOSIS — N941 Unspecified dyspareunia: Secondary | ICD-10-CM

## 2019-04-29 NOTE — Telephone Encounter (Signed)
Pt is calling back in for her imaging results.  Please call pt at: [phone]

## 2019-04-29 NOTE — Telephone Encounter (Signed)
Not sure why notes about xrays are in a med refill encounter.Marland KitchenMarland Kitchen

## 2019-04-29 NOTE — Telephone Encounter (Signed)
Spoke with pt aware that imaging results have not been released yet, verbalized understanding that the office will call her with the results

## 2019-05-28 ENCOUNTER — Ambulatory Visit: Payer: Managed Care, Other (non HMO) | Admitting: Family Medicine

## 2019-06-03 ENCOUNTER — Other Ambulatory Visit: Payer: Self-pay | Admitting: Family Medicine

## 2019-06-03 DIAGNOSIS — I10 Essential (primary) hypertension: Secondary | ICD-10-CM

## 2019-06-24 ENCOUNTER — Telehealth: Payer: Self-pay | Admitting: Family Medicine

## 2019-06-24 NOTE — Telephone Encounter (Signed)
See note  Copied from [place] [id number]. Topic: General - Inquiry >> Jun 24, 2019  8:44 AM Virl Axe D wrote: Reason for CRM: Pt called to request a copy of her xray from 04/24/19. She would like to pick them up. Please advise. >> Jun 24, 2019  9:00 AM Sheran Luz wrote: Patient requesting results be picked up from [place] location, where she had the x-rays done.

## 2019-07-01 NOTE — Telephone Encounter (Signed)
Patient calling again to get a response regarding her x-ray results.  She stated that she needs a copy before an appt. She has today.  Please advise

## 2019-07-03 NOTE — Telephone Encounter (Signed)
Spoke with pt advised that the report was being reviewed by Radiology department and that she will be notified when x ray results are released to Dr Volanda Napoleon, pt verbalized understanding.

## 2024-07-17 ENCOUNTER — Emergency Department (HOSPITAL_BASED_OUTPATIENT_CLINIC_OR_DEPARTMENT_OTHER)
Admission: EM | Admit: 2024-07-17 | Discharge: 2024-07-17 | Disposition: A | Discharge status: Home / Self Care | Payer: Worker's Compensation | Attending: Emergency Medicine | Admitting: Emergency Medicine

## 2024-07-17 ENCOUNTER — Encounter (HOSPITAL_BASED_OUTPATIENT_CLINIC_OR_DEPARTMENT_OTHER): Payer: Self-pay

## 2024-07-17 ENCOUNTER — Other Ambulatory Visit: Payer: Self-pay

## 2024-07-17 DIAGNOSIS — X101XXA Contact with hot food, initial encounter: Secondary | ICD-10-CM | POA: Insufficient documentation

## 2024-07-17 DIAGNOSIS — T2220XA Burn of second degree of shoulder and upper limb, except wrist and hand, unspecified site, initial encounter: Secondary | ICD-10-CM

## 2024-07-17 DIAGNOSIS — T22112A Burn of first degree of left forearm, initial encounter: Secondary | ICD-10-CM | POA: Insufficient documentation

## 2024-07-17 DIAGNOSIS — Z79899 Other long term (current) drug therapy: Secondary | ICD-10-CM | POA: Insufficient documentation

## 2024-07-17 DIAGNOSIS — Y990 Civilian activity done for income or pay: Secondary | ICD-10-CM | POA: Diagnosis not present

## 2024-07-17 DIAGNOSIS — T22012A Burn of unspecified degree of left forearm, initial encounter: Secondary | ICD-10-CM | POA: Diagnosis present

## 2024-07-17 MED ORDER — BACITRACIN ZINC 500 UNIT/GM EX OINT
TOPICAL_OINTMENT | Freq: Two times a day (BID) | CUTANEOUS | Status: DC
  Administered 2024-07-17: 31.5 via TOPICAL
  Filled 2024-07-17: qty 28.35
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
## 2024-07-17 NOTE — ED Notes (Signed)
